# Patient Record
Sex: Female | Born: 1955 | Race: White | Hispanic: No | Marital: Married | Smoking: Never smoker
Health system: Southern US, Community
[De-identification: ages and names within clinical notes are randomized; demographics above are authoritative.]

## PROBLEM LIST (undated history)

## (undated) DIAGNOSIS — G43909 Migraine, unspecified, not intractable, without status migrainosus: Secondary | ICD-10-CM

## (undated) DIAGNOSIS — G709 Myoneural disorder, unspecified: Secondary | ICD-10-CM

## (undated) DIAGNOSIS — R7303 Prediabetes: Secondary | ICD-10-CM

## (undated) DIAGNOSIS — E229 Hyperfunction of pituitary gland, unspecified: Secondary | ICD-10-CM

## (undated) DIAGNOSIS — Z8669 Personal history of other diseases of the nervous system and sense organs: Secondary | ICD-10-CM

## (undated) DIAGNOSIS — D352 Benign neoplasm of pituitary gland: Secondary | ICD-10-CM

## (undated) DIAGNOSIS — M1990 Unspecified osteoarthritis, unspecified site: Secondary | ICD-10-CM

## (undated) HISTORY — DX: Hyperfunction of pituitary gland, unspecified: E22.9

## (undated) HISTORY — DX: Personal history of other diseases of the nervous system and sense organs: Z86.69

## (undated) HISTORY — PX: JOINT REPLACEMENT: SHX530

## (undated) HISTORY — DX: Benign neoplasm of pituitary gland: D35.2

## (undated) HISTORY — DX: Myoneural disorder, unspecified: G70.9

## (undated) HISTORY — PX: KNEE ARTHROSCOPY: SUR90

## (undated) HISTORY — PX: ABDOMINAL HYSTERECTOMY: SHX81

---

## 2004-10-22 ENCOUNTER — Ambulatory Visit: Payer: Self-pay | Admitting: Internal Medicine

## 2004-11-07 LAB — HM COLONOSCOPY: HM Colonoscopy: NORMAL

## 2006-10-04 ENCOUNTER — Ambulatory Visit: Payer: Self-pay | Admitting: Gastroenterology

## 2006-10-24 ENCOUNTER — Ambulatory Visit: Payer: Self-pay | Admitting: Gastroenterology

## 2007-01-23 ENCOUNTER — Ambulatory Visit: Payer: Self-pay | Admitting: Orthopaedic Surgery

## 2007-02-07 ENCOUNTER — Ambulatory Visit: Payer: Self-pay | Admitting: Orthopaedic Surgery

## 2007-02-14 ENCOUNTER — Ambulatory Visit: Payer: Self-pay | Admitting: Orthopaedic Surgery

## 2010-10-24 LAB — HM PAP SMEAR: HM Pap smear: NEGATIVE

## 2010-10-31 LAB — HM MAMMOGRAPHY: HM Mammogram: NORMAL

## 2011-05-19 ENCOUNTER — Encounter: Payer: Self-pay | Admitting: Internal Medicine

## 2011-05-19 ENCOUNTER — Ambulatory Visit (INDEPENDENT_AMBULATORY_CARE_PROVIDER_SITE_OTHER): Payer: BC Managed Care – PPO | Admitting: Internal Medicine

## 2011-05-19 VITALS — BP 138/70 | HR 84 | Temp 98.1°F | Resp 14 | Ht 63.0 in | Wt 134.5 lb

## 2011-05-19 DIAGNOSIS — R5383 Other fatigue: Secondary | ICD-10-CM

## 2011-05-19 DIAGNOSIS — Z1211 Encounter for screening for malignant neoplasm of colon: Secondary | ICD-10-CM

## 2011-05-19 DIAGNOSIS — E559 Vitamin D deficiency, unspecified: Secondary | ICD-10-CM

## 2011-05-19 DIAGNOSIS — G2581 Restless legs syndrome: Secondary | ICD-10-CM

## 2011-05-19 DIAGNOSIS — J329 Chronic sinusitis, unspecified: Secondary | ICD-10-CM

## 2011-05-19 DIAGNOSIS — K5909 Other constipation: Secondary | ICD-10-CM

## 2011-05-19 DIAGNOSIS — G709 Myoneural disorder, unspecified: Secondary | ICD-10-CM | POA: Insufficient documentation

## 2011-05-19 DIAGNOSIS — G4700 Insomnia, unspecified: Secondary | ICD-10-CM

## 2011-05-19 DIAGNOSIS — J45990 Exercise induced bronchospasm: Secondary | ICD-10-CM | POA: Insufficient documentation

## 2011-05-19 DIAGNOSIS — D229 Melanocytic nevi, unspecified: Secondary | ICD-10-CM

## 2011-05-19 DIAGNOSIS — K5900 Constipation, unspecified: Secondary | ICD-10-CM

## 2011-05-19 DIAGNOSIS — R5381 Other malaise: Secondary | ICD-10-CM

## 2011-05-19 DIAGNOSIS — E538 Deficiency of other specified B group vitamins: Secondary | ICD-10-CM

## 2011-05-19 DIAGNOSIS — D239 Other benign neoplasm of skin, unspecified: Secondary | ICD-10-CM

## 2011-05-19 LAB — COMPREHENSIVE METABOLIC PANEL
AST: 16 U/L (ref 0–37)
BUN: 20 mg/dL (ref 6–23)
CO2: 26 mEq/L (ref 19–32)
Calcium: 9.5 mg/dL (ref 8.4–10.5)
Chloride: 103 mEq/L (ref 96–112)
Creat: 0.85 mg/dL (ref 0.50–1.10)
Total Bilirubin: 0.3 mg/dL (ref 0.3–1.2)

## 2011-05-19 LAB — CBC WITH DIFFERENTIAL/PLATELET
HCT: 38 % (ref 36.0–46.0)
Hemoglobin: 12.4 g/dL (ref 12.0–15.0)
Lymphocytes Relative: 36 % (ref 12–46)
Monocytes Absolute: 0.6 10*3/uL (ref 0.1–1.0)
Monocytes Relative: 8 % (ref 3–12)
Neutro Abs: 3.7 10*3/uL (ref 1.7–7.7)
WBC: 7.2 10*3/uL (ref 4.0–10.5)

## 2011-05-19 MED ORDER — PREDNISONE (PAK) 10 MG PO TABS: ORAL_TABLET | ORAL | Status: AC

## 2011-05-19 MED ORDER — AMOXICILLIN-POT CLAVULANATE 875-125 MG PO TABS: 1.0000 | ORAL_TABLET | Freq: Two times a day (BID) | ORAL | Status: AC

## 2011-05-19 MED ORDER — LACTULOSE 20 GM/30ML PO SOLN: 30.0000 mL | Freq: Four times a day (QID) | ORAL | Status: AC | PRN

## 2011-05-19 NOTE — Patient Instructions (Addendum)
Please try Sudafed PE 10 mg in the morning once.  Use afrin later in the day to manage congestion.  We will use Augmentin for 10 days .    Four your insomnia and restless legs,  Try a combination of 50 mg trazodone and 300 mg neurontin at bedtime before you increase the trazodone dose.   Take the lactulose every 6 hours until you feel you have evacuated your bowel,s  Then start daily miralax.  If this does not impmorve you constipaiton,  Call and we will try the new medication (amitiza)

## 2011-05-19 NOTE — Progress Notes (Signed)
  Subjective:    Patient ID: Jo Ryan, female    DOB: 15-Sep-1955, 55 y.o.   MRN: [id number]  HPI 55 yo white female transferring care from [facility] presents for establishment of care,  She has several complaints. Her first is persistent sinus and facial pain for the past 5 or 6  days,  Symptoms on and off for 3 weeks,  Asthma but no allergic rhinitis.  2nd one is chronic constipation with prior trial of herbal teas containing senna      Review of Systems  Constitutional: Negative for fever, chills and unexpected weight change.  HENT: Negative for hearing loss, ear pain, nosebleeds, congestion, sore throat, facial swelling, rhinorrhea, sneezing, mouth sores, trouble swallowing, neck pain, neck stiffness, voice change, postnasal drip, sinus pressure, tinnitus and ear discharge.   Eyes: Negative for pain, discharge, redness and visual disturbance.  Respiratory: Negative for cough, chest tightness, shortness of breath, wheezing and stridor.   Cardiovascular: Negative for chest pain, palpitations and leg swelling.  Musculoskeletal: Negative for myalgias and arthralgias.  Skin: Negative for color change and rash.  Neurological: Negative for dizziness, weakness, light-headedness and headaches.  Hematological: Negative for adenopathy.       Objective:   Physical Exam  Constitutional: She is oriented to person, place, and time. She appears well-developed and well-nourished.  HENT:  Mouth/Throat: Oropharynx is clear and moist.  Eyes: EOM are normal. Pupils are equal, round, and reactive to light. No scleral icterus.  Neck: Normal range of motion. Neck supple. No JVD present. No thyromegaly present.  Cardiovascular: Normal rate, regular rhythm, normal heart sounds and intact distal pulses.   Pulmonary/Chest: Effort normal and breath sounds normal.  Abdominal: Soft. Bowel sounds are normal. She exhibits no mass. There is no tenderness.  Musculoskeletal: Normal range of motion. She  exhibits no edema.  Lymphadenopathy:    She has no cervical adenopathy.  Neurological: She is alert and oriented to person, place, and time.  Skin: Skin is warm and dry.  Psychiatric: She has a normal mood and affect.          Assessment & Plan:  Sinusitis, acute:  will treat with augmentin decongestant, and saline nasal spray.

## 2011-05-21 ENCOUNTER — Encounter: Payer: Self-pay | Admitting: Internal Medicine

## 2011-05-21 DIAGNOSIS — G2581 Restless legs syndrome: Secondary | ICD-10-CM | POA: Insufficient documentation

## 2011-05-21 DIAGNOSIS — K5909 Other constipation: Secondary | ICD-10-CM | POA: Insufficient documentation

## 2011-05-21 DIAGNOSIS — Z8669 Personal history of other diseases of the nervous system and sense organs: Secondary | ICD-10-CM | POA: Insufficient documentation

## 2011-05-21 DIAGNOSIS — K581 Irritable bowel syndrome with constipation: Secondary | ICD-10-CM | POA: Insufficient documentation

## 2011-05-21 DIAGNOSIS — D352 Benign neoplasm of pituitary gland: Secondary | ICD-10-CM | POA: Insufficient documentation

## 2011-05-21 NOTE — Assessment & Plan Note (Signed)
Discussed use of amitiza but will first try trial of lactulose followed by daily miralax.  She is up to date on screening colonoscopy

## 2011-05-29 ENCOUNTER — Telehealth: Payer: Self-pay | Admitting: Internal Medicine

## 2011-05-29 NOTE — Telephone Encounter (Signed)
Please call patient with her lab results.

## 2011-05-29 NOTE — Telephone Encounter (Signed)
Morrie Sheldon left her a message on Friday to call back.  Her b12 and vitmamin d are a little low,  everything else is fine. Is she taking any vitmamin supplements that have b12 or vitamin d in them and how much?

## 2011-05-30 ENCOUNTER — Telehealth: Payer: Self-pay | Admitting: Internal Medicine

## 2011-05-30 NOTE — Telephone Encounter (Signed)
I would recommend tha tshe start taking otc vitmain D supplement 1000 units daily,  And B 12 sublingual tablets one  Tablet daily,. Repeat both levels  Vit 25 OHD and Vitamin B12  in 3 months.

## 2011-05-30 NOTE — Telephone Encounter (Signed)
Patient labs showed B-12 and D were borderline low. Patient informed me she is not taking any supplements. What should she do next?

## 2011-05-31 NOTE — Telephone Encounter (Signed)
I informed patient that you recommend she take OTC Vitamin D supplement 1000 units daily and B12 sublingual tablet once a day and we need to repeat both levels in 3 months.

## 2011-07-13 ENCOUNTER — Telehealth: Payer: Self-pay | Admitting: Internal Medicine

## 2011-07-14 ENCOUNTER — Ambulatory Visit (INDEPENDENT_AMBULATORY_CARE_PROVIDER_SITE_OTHER): Payer: BC Managed Care – PPO | Admitting: Internal Medicine

## 2011-07-14 ENCOUNTER — Encounter: Payer: Self-pay | Admitting: Internal Medicine

## 2011-07-14 VITALS — BP 138/80 | HR 67 | Temp 98.3°F | Resp 14 | Ht 63.0 in | Wt 139.5 lb

## 2011-07-14 DIAGNOSIS — H6690 Otitis media, unspecified, unspecified ear: Secondary | ICD-10-CM

## 2011-07-14 MED ORDER — PREDNISONE (PAK) 10 MG PO TABS: ORAL_TABLET | ORAL | Status: AC

## 2011-07-14 MED ORDER — AMOXICILLIN-POT CLAVULANATE 875-125 MG PO TABS: 1.0000 | ORAL_TABLET | Freq: Two times a day (BID) | ORAL | Status: AC

## 2011-07-14 MED ORDER — MECLIZINE HCL 25 MG PO TABS: 25.0000 mg | ORAL_TABLET | Freq: Three times a day (TID) | ORAL | Status: AC | PRN

## 2011-07-14 NOTE — Progress Notes (Signed)
  Subjective:    Patient ID: Jo Ryan, female    DOB: 1955/12/22, 55 y.o.   MRN: [id number]  HPI  55 yo white female presents with recurrent vertigo over the past week.  Episodes have occurred while she is in the shower and at work,  Most episodes preciptated by tilting her head back. No prior episodes of vertigo.  Concurrent symptoms include a dull headache on and off for the past  occurreing on the right side, along with sinus drainage and ear fullness.  No fevers or neck stiffness.  No vision changes or other neurologic symptoms.  Past Medical History  Diagnosis Date  . Asthma   . Vitamin D deficiency   . Neuromuscular disorder     cervical disk disease  . Pituitary microadenoma with hyperprolactinemia     treated with Parlodel  . History of migraine headaches    Current Outpatient Prescriptions on File Prior to Visit  Medication Sig Dispense Refill  . butalbital-acetaminophen-caffeine (FIORICET, ESGIC) 50-325-40 MG per tablet Take 1 tablet by mouth every 4 (four) hours as needed. Do not exceed 6 tablets per day.       . Fluticasone-Salmeterol (ADVAIR) 250-50 MCG/DOSE AEPB Inhale 1 puff into the lungs as needed.        . gabapentin (NEURONTIN) 300 MG capsule Take 300 mg by mouth 2 (two) times daily as needed.        . traZODone (DESYREL) 50 MG tablet Take 50 mg by mouth at bedtime.          Review of Systems  Constitutional: Negative for fever, chills and unexpected weight change.  HENT: Positive for ear pain, congestion and postnasal drip. Negative for hearing loss, nosebleeds, sore throat, facial swelling, rhinorrhea, sneezing, mouth sores, trouble swallowing, neck pain, neck stiffness, voice change, sinus pressure, tinnitus and ear discharge.   Eyes: Negative for pain, discharge, redness and visual disturbance.  Respiratory: Negative for cough, chest tightness, shortness of breath, wheezing and stridor.   Cardiovascular: Negative for chest pain, palpitations and leg swelling.    Musculoskeletal: Negative for myalgias and arthralgias.  Skin: Negative for color change and rash.  Neurological: Positive for dizziness and headaches. Negative for weakness and light-headedness.  Hematological: Negative for adenopathy.       Objective:   Physical Exam  Constitutional: She is oriented to person, place, and time. She appears well-developed and well-nourished.  HENT:  Right Ear: Tympanic membrane is erythematous and bulging.  Left Ear: Tympanic membrane is bulging.  Mouth/Throat: Oropharynx is clear and moist.  Eyes: EOM are normal. Pupils are equal, round, and reactive to light. No scleral icterus.  Neck: Normal range of motion. Neck supple. No JVD present. No thyromegaly present.  Cardiovascular: Normal rate, regular rhythm, normal heart sounds and intact distal pulses.   Pulmonary/Chest: Effort normal and breath sounds normal.  Abdominal: Soft. Bowel sounds are normal. She exhibits no mass. There is no tenderness.  Musculoskeletal: Normal range of motion. She exhibits no edema.  Lymphadenopathy:    She has no cervical adenopathy.  Neurological: She is alert and oriented to person, place, and time.  Skin: Skin is warm and dry.  Psychiatric: She has a normal mood and affect.          Assessment & Plan:  Vertigo;  Secondary to otitis media suggested by exam.  Abx, prednisone, sudafed PE and meclizine

## 2011-07-16 ENCOUNTER — Encounter: Payer: Self-pay | Admitting: Internal Medicine

## 2011-07-16 MED ORDER — FLUTICASONE FUROATE 27.5 MCG/SPRAY NA SUSP: 1.0000 | Freq: Every day | NASAL | Status: DC

## 2011-07-16 NOTE — Patient Instructions (Signed)
I am treating you for otitis media which appears to be the cause of your vertigo.  If your symptoms do no imporve in week, please let me know.

## 2011-10-09 ENCOUNTER — Ambulatory Visit (INDEPENDENT_AMBULATORY_CARE_PROVIDER_SITE_OTHER): Payer: BC Managed Care – PPO | Admitting: Internal Medicine

## 2011-10-09 ENCOUNTER — Encounter: Payer: Self-pay | Admitting: Internal Medicine

## 2011-10-09 VITALS — BP 134/68 | HR 69 | Temp 98.4°F | Resp 14 | Ht 64.0 in | Wt 135.8 lb

## 2011-10-09 DIAGNOSIS — J40 Bronchitis, not specified as acute or chronic: Secondary | ICD-10-CM

## 2011-10-09 DIAGNOSIS — J329 Chronic sinusitis, unspecified: Secondary | ICD-10-CM

## 2011-10-09 MED ORDER — PREDNISONE (PAK) 10 MG PO TABS: ORAL_TABLET | ORAL | Status: AC

## 2011-10-09 MED ORDER — TRAZODONE HCL 50 MG PO TABS: 50.0000 mg | ORAL_TABLET | Freq: Every day | ORAL | Status: AC

## 2011-10-09 MED ORDER — AMOXICILLIN-POT CLAVULANATE 875-125 MG PO TABS: 1.0000 | ORAL_TABLET | Freq: Two times a day (BID) | ORAL | Status: AC

## 2011-10-09 MED ORDER — FLUTICASONE FUROATE 27.5 MCG/SPRAY NA SUSP: 1.0000 | Freq: Every day | NASAL | Status: DC

## 2011-10-09 MED ORDER — HYDROCOD POLST-CHLORPHEN POLST 10-8 MG/5ML PO LQCR: 5.0000 mL | Freq: Two times a day (BID) | ORAL | Status: DC | PRN

## 2011-10-09 NOTE — Assessment & Plan Note (Signed)
Given chronicity of symptoms, development of facial pain and exam consistent with bacterial URI,  Will treat with empiric antibiotics, decongestants, and saline lavage.   

## 2011-10-09 NOTE — Patient Instructions (Addendum)
Augmentin two times daily for 7 days  , take with food   Prednisone taper for the wheezing and the laryngitis.   Tussionex for nighttime cough,  Delsym for the cough   Rest your voice as much as possilble

## 2011-10-09 NOTE — Progress Notes (Signed)
Subjective:    Patient ID: Jo Ryan, female    DOB: [birth year], 56 y.o.   MRN: [id number]  HPI  [name] is a 56 yr old white female who presents with head cold of several days, accompanied by cough , sore throat,  Achiness, which started yesterday ,  Productive cough,  purulent bronchitis.   Some wheezing.    Past Medical History  Diagnosis Date  . Asthma   . Vitamin d deficiency   . Neuromuscular disorder     cervical disk disease  . Pituitary microadenoma with hyperprolactinemia     treated with Parlodel  . History of migraine headaches    Current Outpatient Prescriptions on File Prior to Visit  Medication Sig Dispense Refill  . butalbital-acetaminophen-caffeine (FIORICET, ESGIC) 50-325-40 MG per tablet Take 1 tablet by mouth every 4 (four) hours as needed. Do not exceed 6 tablets per day.       . Fluticasone-Salmeterol (ADVAIR) 250-50 MCG/DOSE AEPB Inhale 1 puff into the lungs as needed.        . gabapentin (NEURONTIN) 300 MG capsule Take 300 mg by mouth 2 (two) times daily as needed.        . meclizine (ANTIVERT) 25 MG tablet Take 1 tablet (25 mg total) by mouth 3 (three) times daily as needed for dizziness or nausea.  30 tablet  1     Review of Systems  Constitutional: Negative for fever, chills and unexpected weight change.  HENT: Negative for hearing loss, ear pain, nosebleeds, congestion, sore throat, facial swelling, rhinorrhea, sneezing, mouth sores, trouble swallowing, neck pain, neck stiffness, voice change, postnasal drip, sinus pressure, tinnitus and ear discharge.   Eyes: Negative for pain, discharge, redness and visual disturbance.  Respiratory: Negative for cough, chest tightness, shortness of breath, wheezing and stridor.   Cardiovascular: Negative for chest pain, palpitations and leg swelling.  Musculoskeletal: Negative for myalgias and arthralgias.  Skin: Negative for color change and rash.  Neurological: Negative for dizziness, weakness, light-headedness  and headaches.  Hematological: Negative for adenopathy.       Objective:   Physical Exam  Constitutional: She is oriented to person, place, and time. She appears well-developed and well-nourished.  HENT:  Nose: Right sinus exhibits maxillary sinus tenderness. Left sinus exhibits maxillary sinus tenderness.  Mouth/Throat: Oropharynx is clear and moist.  Eyes: EOM are normal. Pupils are equal, round, and reactive to light. No scleral icterus.  Neck: Normal range of motion. Neck supple. No JVD present. No thyromegaly present.  Cardiovascular: Normal rate, regular rhythm, normal heart sounds and intact distal pulses.   Pulmonary/Chest: Effort normal. She has wheezes.  Abdominal: Soft. Bowel sounds are normal. She exhibits no mass. There is no tenderness.  Musculoskeletal: Normal range of motion. She exhibits no edema.  Lymphadenopathy:    She has no cervical adenopathy.  Neurological: She is alert and oriented to person, place, and time.  Skin: Skin is warm and dry.  Psychiatric: She has a normal mood and affect.          Assessment & Plan:   Sinusitis Given chronicity of symptoms, development of facial pain and exam consistent with bacterial URI,  Will treat with empiric antibiotics, decongestants, and saline lavage.     Updated Medication List Outpatient Encounter Prescriptions as of 10/09/2011  Medication Sig Dispense Refill  . butalbital-acetaminophen-caffeine (FIORICET, ESGIC) 50-325-40 MG per tablet Take 1 tablet by mouth every 4 (four) hours as needed. Do not exceed 6 tablets per day.       Marland Kitchen  fluticasone (VERAMYST) 27.5 MCG/SPRAY nasal spray Place 1 spray into the nose daily.  10 g  3  . Fluticasone-Salmeterol (ADVAIR) 250-50 MCG/DOSE AEPB Inhale 1 puff into the lungs as needed.        . gabapentin (NEURONTIN) 300 MG capsule Take 300 mg by mouth 2 (two) times daily as needed.        . meclizine (ANTIVERT) 25 MG tablet Take 1 tablet (25 mg total) by mouth 3 (three) times  daily as needed for dizziness or nausea.  30 tablet  1  . traZODone (DESYREL) 50 MG tablet Take 1 tablet (50 mg total) by mouth at bedtime.  30 tablet  3  . DISCONTD: fluticasone (VERAMYST) 27.5 MCG/SPRAY nasal spray Place 1 spray into the nose daily.  10 g  2  . DISCONTD: traZODone (DESYREL) 50 MG tablet Take 50 mg by mouth at bedtime.        Marland Kitchen amoxicillin-clavulanate (AUGMENTIN) 875-125 MG per tablet Take 1 tablet by mouth 2 (two) times daily.  14 tablet  0  . chlorpheniramine-HYDROcodone (TUSSIONEX PENNKINETIC ER) 10-8 MG/5ML LQCR Take 5 mLs by mouth every 12 (twelve) hours as needed.  200 mL  0  . predniSONE (STERAPRED UNI-PAK) 10 MG tablet 6 tablets on Day 1 , then reduce by 1 tablet daily until gone  21 tablet  0  . DISCONTD: chlorpheniramine-HYDROcodone (TUSSIONEX PENNKINETIC ER) 10-8 MG/5ML LQCR Take 5 mLs by mouth every 12 (twelve) hours as needed.  200 mL  0

## 2011-10-27 ENCOUNTER — Ambulatory Visit: Payer: BC Managed Care – PPO | Admitting: Internal Medicine

## 2011-11-03 NOTE — Telephone Encounter (Signed)
error 

## 2011-11-08 ENCOUNTER — Encounter: Payer: Self-pay | Admitting: Internal Medicine

## 2011-11-08 ENCOUNTER — Ambulatory Visit (INDEPENDENT_AMBULATORY_CARE_PROVIDER_SITE_OTHER): Payer: BC Managed Care – PPO | Admitting: Internal Medicine

## 2011-11-08 VITALS — BP 124/60 | HR 90 | Temp 98.9°F | Resp 16 | Wt 137.2 lb

## 2011-11-08 DIAGNOSIS — J45990 Exercise induced bronchospasm: Secondary | ICD-10-CM

## 2011-11-08 DIAGNOSIS — K581 Irritable bowel syndrome with constipation: Secondary | ICD-10-CM

## 2011-11-08 DIAGNOSIS — Z13220 Encounter for screening for lipoid disorders: Secondary | ICD-10-CM

## 2011-11-08 DIAGNOSIS — K589 Irritable bowel syndrome without diarrhea: Secondary | ICD-10-CM

## 2011-11-08 DIAGNOSIS — E559 Vitamin D deficiency, unspecified: Secondary | ICD-10-CM

## 2011-11-08 DIAGNOSIS — Z1211 Encounter for screening for malignant neoplasm of colon: Secondary | ICD-10-CM

## 2011-11-08 DIAGNOSIS — G2581 Restless legs syndrome: Secondary | ICD-10-CM

## 2011-11-08 DIAGNOSIS — M130 Polyarthritis, unspecified: Secondary | ICD-10-CM

## 2011-11-08 DIAGNOSIS — Z0000 Encounter for general adult medical examination without abnormal findings: Secondary | ICD-10-CM

## 2011-11-08 MED ORDER — PREDNISONE (PAK) 10 MG PO TABS: ORAL_TABLET | ORAL | Status: AC

## 2011-11-08 NOTE — Patient Instructions (Signed)
I am giving you samples of amitiza which is approved for treatment of both constipation due to IBS.  Start with the 8 mcg dose,  One tablet twice tmes per day  If after one week your bowel schedule has not improved, try the higher dose.    Return for fasting labs at your convenience.

## 2011-11-08 NOTE — Assessment & Plan Note (Addendum)
She has been using suppositories to help defecate.  She has no prior trial of an adhesive increasing her fiber consumption is difficult due to her allergy to wheat. Discussed a trial of hematochezia and samples given of both doses 8 mcg and 24 mcg. I have advised that she start with the 8 mcg twice daily dose and increase to 24 after one week if there is no change in bowel habits.

## 2011-11-08 NOTE — Assessment & Plan Note (Signed)
With recent exacerbation treated by urgent care without prednisone ,  Will give patient rx for prednisone taper to keep on hand  For the next episode.

## 2011-11-08 NOTE — Progress Notes (Signed)
Patient ID: Jo Ryan, female   DOB: June 23, 1956, 56 y.o.   MRN: [id number]   Patient Active Problem List  Diagnoses  . Exercise-induced asthma  . Insomnia  . Neuromuscular disorder  . Screening for colon cancer  . Restless legs syndrome  . Pituitary microadenoma with hyperprolactinemia  . History of migraine headaches  . Irritable bowel syndrome with constipation  . Sinusitis  . Polyarthritis    Subjective:  CC:   Chief Complaint  Patient presents with  . Gynecologic Exam    doesnt know if she needs PAP    HPI:   Jo Ryan is a 56 y.o. female who presents for her annual exam.  She was recently treated for a URI with levaquin by Urgent care for cough, sinus drainage, subjective fever .  Sought medical attention after  Less than 24 hours of symptoms bc of concern for asthma recurrence .  Was not given prednisone for unclear reasons,  But had used her albuterol MDI just prior to evaluation so may not have been wheezing.  Per patient her room air 02 sats were 99% Symptoms have resolved.     Her other issues today is the recurretce of pain in multiple joints on both hands/fingers  Have been swollen and tender,  using alleve prn .  Has had knee pain as well, but has a history of arthroscopic surgeries for ligament repairs.     Past Medical History  Diagnosis Date  . Asthma   . Vitamin d deficiency   . Neuromuscular disorder     cervical disk disease  . Pituitary microadenoma with hyperprolactinemia     treated with Parlodel  . History of migraine headaches     Past Surgical History  Procedure Date  . Joint replacement     arthroscopy both knees  . Abdominal hysterectomy     age 63         The following portions of the patient's history were reviewed and updated as appropriate: Allergies, current medications, and problem list.    Review of Systems:   12 Pt  review of systems was negative except those addressed in the HPI,     History   Social  History  . Marital Status: Married    Spouse Name: N/A    Number of Children: N/A  . Years of Education: N/A   Occupational History  . Not on file.   Social History Main Topics  . Smoking status: Never Smoker   . Smokeless tobacco: Never Used  . Alcohol Use: Yes     occasional  . Drug Use: No  . Sexually Active: Not on file   Other Topics Concern  . Not on file   Social History Narrative  . No narrative on file    Objective:  BP 124/60  Pulse 90  Temp(Src) 98.9 F (37.2 C) (Oral)  Resp 16  Wt 137 lb 4 oz (62.256 kg)  SpO2 98%  General appearance: alert, cooperative and appears stated age Ears: normal TM's and external ear canals both ears Throat: lips, mucosa, and tongue normal; teeth and gums normal Neck: no adenopathy, no carotid bruit, supple, symmetrical, trachea midline and thyroid not enlarged, symmetric, no tenderness/mass/nodules Back: symmetric, no curvature. ROM normal. No CVA tenderness. Lungs: clear to auscultation bilaterally Breasts:  Normal, symmetric,  No masses or lesions.  Heart: regular rate and rhythm, S1, S2 normal, no murmur, click, rub or gallop Abdomen: soft, non-tender; bowel sounds normal; no masses,  no organomegaly Pulses: 2+ and symmetric Skin: Skin color, texture, turgor normal. No rashes or lesions Lymph nodes: Cervical, supraclavicular, and axillary nodes normal. GYN: deferred per patient request.  Assessment and Plan:  Irritable bowel syndrome with constipation She has been using suppositories to help defecate.  She has no prior trial of an adhesive increasing her fiber consumption is difficult due to her allergy to wheat. Discussed a trial of hematochezia and samples given of both doses 8 mcg and 24 mcg. I have advised that she start with the 8 mcg twice daily dose and increase to 24 after one week if there is no change in bowel habits.      Exercise-induced asthma With recent exacerbation treated by urgent care without  prednisone ,  Will give patient rx for prednisone taper to keep on hand  For the next episode.   Screening for colon cancer She had a normal screening colonoscopy at age 68. There is no family history of colon cancer so she is not due again for 3 years  Restless legs syndrome Symptoms appear to be controlled with trazodone gabapentin. No changes today  Polyarthritis she has had multiple joints painful several weeks. The joints are involved both hands both knees and elbows. Will initiate serologic workup for inflammatory conditions and including autoimmune disorders and gout.    Updated Medication List Outpatient Encounter Prescriptions as of 11/08/2011  Medication Sig Dispense Refill  . butalbital-acetaminophen-caffeine (FIORICET, ESGIC) 50-325-40 MG per tablet Take 1 tablet by mouth every 4 (four) hours as needed. Do not exceed 6 tablets per day.       . Fluticasone-Salmeterol (ADVAIR) 250-50 MCG/DOSE AEPB Inhale 1 puff into the lungs as needed.        . meclizine (ANTIVERT) 25 MG tablet Take 1 tablet (25 mg total) by mouth 3 (three) times daily as needed for dizziness or nausea.  30 tablet  1  . traZODone (DESYREL) 50 MG tablet Take 1 tablet (50 mg total) by mouth at bedtime.  30 tablet  3  . DISCONTD: gabapentin (NEURONTIN) 300 MG capsule Take 300 mg by mouth 2 (two) times daily as needed.        . lubiprostone (AMITIZA) 24 MCG capsule Take 1 capsule (24 mcg total) by mouth 2 (two) times daily with a meal.  14 capsule  0  . predniSONE (STERAPRED UNI-PAK) 10 MG tablet 6 tablets on Day 1 , then reduce by 1 tablet daily until gone  21 tablet  0  . DISCONTD: chlorpheniramine-HYDROcodone (TUSSIONEX PENNKINETIC ER) 10-8 MG/5ML LQCR Take 5 mLs by mouth every 12 (twelve) hours as needed.  200 mL  0  . DISCONTD: fluticasone (VERAMYST) 27.5 MCG/SPRAY nasal spray Place 1 spray into the nose daily.  10 g  3     Orders Placed This Encounter  Procedures  . Lipid panel  . COMPLETE METABOLIC PANEL  WITH GFR  . Magnesium  . Rheumatoid factor  . ANA  . CBC with Differential  . Vitamin D (25 hydroxy)  . Sedimentation rate  . C-reactive protein  . HM COLONOSCOPY    No Follow-up on file.

## 2011-11-09 ENCOUNTER — Telehealth: Payer: Self-pay | Admitting: *Deleted

## 2011-11-09 ENCOUNTER — Other Ambulatory Visit (INDEPENDENT_AMBULATORY_CARE_PROVIDER_SITE_OTHER): Payer: BC Managed Care – PPO | Admitting: *Deleted

## 2011-11-09 ENCOUNTER — Other Ambulatory Visit: Payer: Self-pay | Admitting: *Deleted

## 2011-11-09 DIAGNOSIS — K581 Irritable bowel syndrome with constipation: Secondary | ICD-10-CM

## 2011-11-09 DIAGNOSIS — Z13220 Encounter for screening for lipoid disorders: Secondary | ICD-10-CM

## 2011-11-09 DIAGNOSIS — M130 Polyarthritis, unspecified: Secondary | ICD-10-CM

## 2011-11-09 DIAGNOSIS — K589 Irritable bowel syndrome without diarrhea: Secondary | ICD-10-CM

## 2011-11-09 DIAGNOSIS — Z1239 Encounter for other screening for malignant neoplasm of breast: Secondary | ICD-10-CM

## 2011-11-09 LAB — COMPLETE METABOLIC PANEL WITH GFR
Albumin: 4.2 g/dL (ref 3.5–5.2)
Alkaline Phosphatase: 81 U/L (ref 39–117)
BUN: 13 mg/dL (ref 6–23)
CO2: 25 mEq/L (ref 19–32)
GFR, Est African American: >89 mL/min
GFR, Est Non African American: >89 mL/min
Glucose, Bld: 96 mg/dL (ref 70–99)
Potassium: 4.3 mEq/L (ref 3.5–5.3)
Total Protein: 6.4 g/dL (ref 6.0–8.3)

## 2011-11-09 LAB — CBC WITH DIFFERENTIAL/PLATELET
Basophils Absolute: 0 10*3/uL (ref 0.0–0.1)
Eosinophils Relative: 3.8 % (ref 0.0–5.0)
HCT: 36.8 % (ref 36.0–46.0)
Hemoglobin: 12.3 g/dL (ref 12.0–15.0)
Lymphocytes Relative: 39.6 % (ref 12.0–46.0)
Lymphs Abs: 1.8 10*3/uL (ref 0.7–4.0)
Monocytes Relative: 10.7 % (ref 3.0–12.0)
Neutro Abs: 2.1 10*3/uL (ref 1.4–7.7)
RBC: 4.05 Mil/uL (ref 3.87–5.11)
RDW: 13.1 % (ref 11.5–14.6)
WBC: 4.6 10*3/uL (ref 4.5–10.5)

## 2011-11-09 LAB — LIPID PANEL
Cholesterol: 179 mg/dL (ref 0–200)
HDL: 75.1 mg/dL (ref >39.00)
VLDL: 11.6 mg/dL (ref 0.0–40.0)

## 2011-11-09 MED ORDER — GABAPENTIN 300 MG PO CAPS: 300.0000 mg | ORAL_CAPSULE | Freq: Two times a day (BID) | ORAL | Status: DC | PRN

## 2011-11-09 NOTE — Telephone Encounter (Signed)
Patient is asking if she can get an order for a mammogram faxed to [facility]. She will schedule her own appt

## 2011-11-09 NOTE — Telephone Encounter (Signed)
ABSOLUTELY. SHOULD BE ON PRINTER

## 2011-11-10 LAB — C-REACTIVE PROTEIN: CRP: 0.04 mg/dL (ref <0.60)

## 2011-11-10 LAB — RHEUMATOID FACTOR: Rhuematoid fact SerPl-aCnc: <10 IU/mL (ref <=14)

## 2011-11-10 LAB — ANA: Anti Nuclear Antibody(ANA): NEGATIVE

## 2011-11-10 LAB — VITAMIN D 25 HYDROXY (VIT D DEFICIENCY, FRACTURES): Vit D, 25-Hydroxy: 34 ng/mL (ref 30–89)

## 2011-11-11 ENCOUNTER — Encounter: Payer: Self-pay | Admitting: Internal Medicine

## 2011-11-12 ENCOUNTER — Encounter: Payer: Self-pay | Admitting: Internal Medicine

## 2011-11-12 DIAGNOSIS — M130 Polyarthritis, unspecified: Secondary | ICD-10-CM | POA: Insufficient documentation

## 2011-11-12 MED ORDER — LUBIPROSTONE 24 MCG PO CAPS: 24.0000 ug | ORAL_CAPSULE | Freq: Two times a day (BID) | ORAL | Status: AC

## 2011-11-12 NOTE — Assessment & Plan Note (Signed)
Symptoms appear to be controlled with trazodone gabapentin. No changes today

## 2011-11-12 NOTE — Assessment & Plan Note (Signed)
she has had multiple joints painful several weeks. The joints are involved both hands both knees and elbows. Will initiate serologic workup for inflammatory conditions and including autoimmune disorders and gout.

## 2011-11-12 NOTE — Assessment & Plan Note (Signed)
She had a normal screening colonoscopy at age 56. There is no family history of colon cancer so she is not due again for 3 years

## 2011-11-13 NOTE — Telephone Encounter (Signed)
Order faxed to [facility]. Patient is aware and will make her own appt.

## 2011-11-30 ENCOUNTER — Telehealth: Payer: Self-pay | Admitting: Internal Medicine

## 2011-11-30 NOTE — Telephone Encounter (Signed)
Her mammogram was normal.  Repeat in one year 

## 2011-11-30 NOTE — Telephone Encounter (Signed)
Patient notified

## 2011-12-01 ENCOUNTER — Encounter: Payer: Self-pay | Admitting: Internal Medicine

## 2012-03-11 ENCOUNTER — Encounter: Payer: Self-pay | Admitting: Internal Medicine

## 2012-03-11 ENCOUNTER — Ambulatory Visit (INDEPENDENT_AMBULATORY_CARE_PROVIDER_SITE_OTHER): Payer: BC Managed Care – PPO | Admitting: Internal Medicine

## 2012-03-11 VITALS — BP 124/78 | HR 73 | Temp 97.4°F | Resp 14 | Wt 138.8 lb

## 2012-03-11 DIAGNOSIS — J45909 Unspecified asthma, uncomplicated: Secondary | ICD-10-CM

## 2012-03-11 DIAGNOSIS — J329 Chronic sinusitis, unspecified: Secondary | ICD-10-CM

## 2012-03-11 DIAGNOSIS — R1012 Left upper quadrant pain: Secondary | ICD-10-CM | POA: Insufficient documentation

## 2012-03-11 DIAGNOSIS — R109 Unspecified abdominal pain: Secondary | ICD-10-CM

## 2012-03-11 LAB — COMPREHENSIVE METABOLIC PANEL
Alkaline Phosphatase: 69 U/L (ref 39–117)
BUN: 12 mg/dL (ref 6–23)
CO2: 27 mEq/L (ref 19–32)
Creatinine, Ser: 0.7 mg/dL (ref 0.4–1.2)
GFR: 88.61 mL/min (ref >60.00)
Glucose, Bld: 99 mg/dL (ref 70–99)
Sodium: 137 mEq/L (ref 135–145)
Total Bilirubin: 0.3 mg/dL (ref 0.3–1.2)
Total Protein: 7.1 g/dL (ref 6.0–8.3)

## 2012-03-11 LAB — CBC WITH DIFFERENTIAL/PLATELET
Basophils Relative: 0.6 % (ref 0.0–3.0)
Eosinophils Relative: 3.9 % (ref 0.0–5.0)
HCT: 41.4 % (ref 36.0–46.0)
Hemoglobin: 13.5 g/dL (ref 12.0–15.0)
Lymphs Abs: 1.9 10*3/uL (ref 0.7–4.0)
MCV: 91.6 fl (ref 78.0–100.0)
Monocytes Absolute: 0.5 10*3/uL (ref 0.1–1.0)
Monocytes Relative: 7 % (ref 3.0–12.0)
Neutro Abs: 4.4 10*3/uL (ref 1.4–7.7)
Platelets: 178 10*3/uL (ref 150.0–400.0)
RBC: 4.52 Mil/uL (ref 3.87–5.11)
WBC: 7.2 10*3/uL (ref 4.5–10.5)

## 2012-03-11 LAB — POCT URINALYSIS DIPSTICK
Bilirubin, UA: NEGATIVE
Ketones, UA: NEGATIVE
Nitrite, UA: NEGATIVE
Protein, UA: NEGATIVE
pH, UA: 6.5

## 2012-03-11 MED ORDER — ALBUTEROL SULFATE HFA 108 (90 BASE) MCG/ACT IN AERS
2.0000 | INHALATION_SPRAY | Freq: Four times a day (QID) | RESPIRATORY_TRACT | Status: AC | PRN
Start: 2012-03-11 — End: 2019-05-15

## 2012-03-11 MED ORDER — FLUTICASONE PROPIONATE 50 MCG/ACT NA SUSP: 2.0000 | Freq: Every day | NASAL | Status: DC

## 2012-03-11 MED ORDER — LEVOFLOXACIN 500 MG PO TABS
500.0000 mg | ORAL_TABLET | Freq: Every day | ORAL | Status: AC
Start: 2012-03-11 — End: 2012-03-21

## 2012-03-11 NOTE — Progress Notes (Signed)
Patient ID: Jo Ryan, female   DOB: 09/02/1955, 56 y.o.   MRN: [id number]  Patient Active Problem List  Diagnosis  . Exercise-induced asthma  . Insomnia  . Neuromuscular disorder  . Restless legs syndrome  . Pituitary microadenoma with hyperprolactinemia  . History of migraine headaches  . Irritable bowel syndrome with constipation  . Sinusitis  . Polyarthritis  . LUQ pain    Subjective:  CC:   Chief Complaint  Patient presents with  . Sinusitis  . Headache    HPI:   Jo Ryan a 56 y.o. female who presents 1 week history of sinus pain,  Headache, congestion and purulent nasal drainage.  Subjective fevers and chills , spent Sat/Sunday in bed.  Today started having moderate to severe LUQ pain .some abdominal bloating last week due to intake of bread (has gluten intolerance), with weight gain and recent onset of loose stools which started yesterday. Marland Kitchen History of diverticulosis by colonoscopy but no history of diverticulitis.   Past Medical History  Diagnosis Date  . Asthma   . Vitamin d deficiency   . Neuromuscular disorder     cervical disk disease  . Pituitary microadenoma with hyperprolactinemia     treated with Parlodel  . History of migraine headaches     Past Surgical History  Procedure Date  . Joint replacement     arthroscopy both knees  . Abdominal hysterectomy     age 66         The following portions of the patient's history were reviewed and updated as appropriate: Allergies, current medications, and problem list.    Review of Systems:   12 Pt  review of systems was negative except those addressed in the HPI,     History   Social History  . Marital Status: Married    Spouse Name: N/A    Number of Children: N/A  . Years of Education: N/A   Occupational History  . Not on file.   Social History Main Topics  . Smoking status: Never Smoker   . Smokeless tobacco: Never Used  . Alcohol Use: Yes     occasional  . Drug Use: No   . Sexually Active: Not on file   Other Topics Concern  . Not on file   Social History Narrative  . No narrative on file    Objective:  BP 124/78  Pulse 73  Temp 97.4 F (36.3 C) (Oral)  Resp 14  Wt 138 lb 12 oz (62.937 kg)  SpO2 96%  General appearance: alert, cooperative and appears stated age Ears: erythematous  TM's bilaterally Throat: lips, mucosa, and tongue normal; teeth and gums normal Neck: no adenopathy, no carotid bruit, supple, symmetrical, trachea midline and thyroid not enlarged, symmetric, no tenderness/mass/nodules Back: symmetric, no curvature. ROM normal. No CVA tenderness. Lungs: clear to auscultation bilaterally Heart: regular rate and rhythm, S1, S2 normal, no murmur, click, rub or gallop Abdomen: soft, tender in LUQ, without guarding or rebound, bowel sounds normal; no masses,  no organomegaly Pulses: 2+ and symmetric Skin: Skin color, texture, turgor normal. No rashes or lesions Lymph nodes: Cervical, supraclavicular, and axillary nodes normal.  Assessment and Plan:  Sinusitis Given chronicity of symptoms, development of facial pain and exam consistent with bacterial URI,  Will treat with empiric antibiotics, decongestants, and saline lavage.    LUQ pain Etiology likely secondary to irritable bowel . But given her subjective fevers chills, malaise and history of diverticulosis, may need to consider  diverticulitis.  She has been given levaquin for sinusitis, will add metronidazole if no improvement in 2 or 3 days.     Updated Medication List Outpatient Encounter Prescriptions as of 03/11/2012  Medication Sig Dispense Refill  . butalbital-acetaminophen-caffeine (FIORICET, ESGIC) 50-325-40 MG per tablet Take 1 tablet by mouth every 4 (four) hours as needed. Do not exceed 6 tablets per day.       . gabapentin (NEURONTIN) 300 MG capsule Take 1 capsule (300 mg total) by mouth 2 (two) times daily as needed.  60 capsule  0  . meclizine (ANTIVERT) 25 MG  tablet Take 1 tablet (25 mg total) by mouth 3 (three) times daily as needed for dizziness or nausea.  30 tablet  1  . traZODone (DESYREL) 50 MG tablet Take 1 tablet (50 mg total) by mouth at bedtime.  30 tablet  3  . albuterol (PROVENTIL HFA;VENTOLIN HFA) 108 (90 BASE) MCG/ACT inhaler Inhale 2 puffs into the lungs every 6 (six) hours as needed for wheezing.  1 Inhaler  2  . fluticasone (FLONASE) 50 MCG/ACT nasal spray Place 2 sprays into the nose daily.  16 g  6  . levofloxacin (LEVAQUIN) 500 MG tablet Take 1 tablet (500 mg total) by mouth daily.  7 tablet  0  . DISCONTD: Fluticasone-Salmeterol (ADVAIR) 250-50 MCG/DOSE AEPB Inhale 1 puff into the lungs as needed.           Orders Placed This Encounter  Procedures  . Urine Culture  . CBC with Differential  . Comprehensive metabolic panel  . POCT Urinalysis Dipstick    No Follow-up on file.

## 2012-03-11 NOTE — Assessment & Plan Note (Signed)
Etiology likely secondary to irritable bowel . But given her subjective fevers chills, malaise and history of diverticulosis, may need to consider diverticulitis.  She has been given levaquin for sinusitis, will add metronidazole if no improvement in 2 or 3 days.

## 2012-03-11 NOTE — Assessment & Plan Note (Signed)
Given chronicity of symptoms, development of facial pain and exam consistent with bacterial URI,  Will treat with empiric antibiotics, decongestants, and saline lavage.   

## 2012-03-11 NOTE — Patient Instructions (Addendum)
Take the levaquin once daily for 7 days,  If the stools become actual diarrhea, call and i will add metrnidazole  For colitis.  Try Gas X (simethicone) for gas pains.    Please eat some yogurt  Daily

## 2012-03-13 LAB — URINE CULTURE: Colony Count: 50000

## 2012-04-06 ENCOUNTER — Other Ambulatory Visit: Payer: Self-pay | Admitting: Internal Medicine

## 2012-04-08 ENCOUNTER — Other Ambulatory Visit: Payer: Self-pay | Admitting: Internal Medicine

## 2012-04-08 MED ORDER — GABAPENTIN 300 MG PO CAPS: 300.0000 mg | ORAL_CAPSULE | Freq: Two times a day (BID) | ORAL | Status: DC

## 2014-02-27 ENCOUNTER — Ambulatory Visit: Payer: Self-pay | Admitting: Unknown Physician Specialty

## 2015-04-30 ENCOUNTER — Other Ambulatory Visit: Payer: Self-pay | Admitting: Orthopedic Surgery

## 2015-04-30 DIAGNOSIS — M25562 Pain in left knee: Secondary | ICD-10-CM

## 2015-04-30 DIAGNOSIS — M2392 Unspecified internal derangement of left knee: Secondary | ICD-10-CM

## 2015-05-04 ENCOUNTER — Ambulatory Visit
Admission: RE | Admit: 2015-05-04 | Discharge: 2015-05-04 | Disposition: A | Discharge status: Home / Self Care | Payer: BC Managed Care – PPO | Source: Ambulatory Visit | Attending: Orthopedic Surgery | Admitting: Orthopedic Surgery

## 2015-05-04 DIAGNOSIS — M25562 Pain in left knee: Secondary | ICD-10-CM | POA: Diagnosis present

## 2015-05-04 DIAGNOSIS — M2392 Unspecified internal derangement of left knee: Secondary | ICD-10-CM | POA: Diagnosis present

## 2015-05-04 DIAGNOSIS — X58XXXA Exposure to other specified factors, initial encounter: Secondary | ICD-10-CM | POA: Insufficient documentation

## 2015-05-04 DIAGNOSIS — S83242A Other tear of medial meniscus, current injury, left knee, initial encounter: Secondary | ICD-10-CM | POA: Insufficient documentation

## 2016-03-27 DIAGNOSIS — E559 Vitamin D deficiency, unspecified: Secondary | ICD-10-CM | POA: Insufficient documentation

## 2017-01-04 ENCOUNTER — Ambulatory Visit (INDEPENDENT_AMBULATORY_CARE_PROVIDER_SITE_OTHER): Payer: BC Managed Care – PPO | Admitting: Podiatry

## 2017-01-04 ENCOUNTER — Ambulatory Visit (INDEPENDENT_AMBULATORY_CARE_PROVIDER_SITE_OTHER): Payer: BC Managed Care – PPO

## 2017-01-04 ENCOUNTER — Encounter: Payer: Self-pay | Admitting: Podiatry

## 2017-01-04 DIAGNOSIS — M722 Plantar fascial fibromatosis: Secondary | ICD-10-CM

## 2017-01-04 MED ORDER — MELOXICAM 15 MG PO TABS: 15.0000 mg | ORAL_TABLET | Freq: Every day | ORAL | 2 refills | Status: AC

## 2017-01-04 NOTE — Patient Instructions (Signed)

## 2017-01-05 DIAGNOSIS — M722 Plantar fascial fibromatosis: Secondary | ICD-10-CM | POA: Insufficient documentation

## 2017-01-05 NOTE — Progress Notes (Signed)
Subjective:    Patient ID: Jo Ryan, female   DOB: 61 y.o.   MRN: [id number]   HPI 62 year old female presents the office for concerns of painful bunion of her left heel which is been ongoing for 2 weeks. She denies any recent injury or trauma. She has no numbness or tingling. The pain does not wake recommended. Denies any claudication symptoms. The pain is worse in the morning or after periods of rest when she stands back up. She's had no other treatment other than taking Aleve which has not been helping. She has no other concerns today. No other complaints.   Review of Systems  All other systems reviewed and are negative.       Objective:  Physical Exam General: AAO x3, NAD  Dermatological: Skin is warm, dry and supple bilateral. Nails x 10 are well manicured; remaining integument appears unremarkable at this time. There are no open sores, no preulcerative lesions, no rash or signs of infection present.  Vascular: Dorsalis Pedis artery and Posterior Tibial artery pedal pulses are 2/4 bilateral with immedate capillary fill time. Pedal hair growth present. No varicosities and no lower extremity edema present bilateral. There is no pain with calf compression, swelling, warmth, erythema.   Neruologic: Grossly intact via light touch bilateral. Vibratory intact via tuning fork bilateral. Protective threshold with Semmes Wienstein monofilament intact to all pedal sites bilateral. Negative tinel sign.   Musculoskeletal: Tenderness to palpation along the plantar medial tubercle of the calcaneus at the insertion of plantar fascia on the left foot. There is no pain along the course of the plantar fascia within the arch of the foot. Plantar fascia appears to be intact. There is no pain with lateral compression of the calcaneus or pain with vibratory sensation. There is no pain along the course or insertion of the achilles tendon. No other areas of tenderness to bilateral lower extremities.  Muscular strength 5/5 in all groups tested bilateral.  Gait: Unassisted, Nonantalgic.      Assessment:     61 year old female left heel pain likely plantar fasciitis    Plan:     -Treatment options discussed including all alternatives, risks, and complications -Etiology of symptoms were discussed -X-rays were obtained and reviewed with the patient. No evidence of acute fracture identified. -Patient elects to proceed with steroid injection into the left heel. Under sterile skin preparation, a total of 2.5cc of kenalog 10, 0.5% Marcaine plain, and 2% lidocaine plain were infiltrated into the symptomatic area without complication. A band-aid was applied. Patient tolerated the injection well without complication. Post-injection care with discussed with the patient. Discussed with the patient to ice the area over the next couple of days to help prevent a steroid flare.  -Prescribed mobic. Discussed side effects of the medication and directed to stop if any are to occur and call the office.  -Plantar fascial brace dispensed -Discussed shoe gear modifications and orthotics -Stretching and icing exercises daily -Follow-up in 3 weeks or sooner.  Celesta Gentile, DPM

## 2017-03-29 DIAGNOSIS — R7303 Prediabetes: Secondary | ICD-10-CM | POA: Insufficient documentation

## 2019-04-04 DIAGNOSIS — J321 Chronic frontal sinusitis: Secondary | ICD-10-CM | POA: Insufficient documentation

## 2019-05-16 NOTE — Discharge Instructions (Signed)
[facility] °[facility] °ENDOSCOPIC SINUS SURGERY °[facility] ° °What is Functional Endoscopic Sinus Surgery? ° The Surgery involves making the natural openings of the sinuses larger by removing the bony partitions that separate the sinuses from the nasal cavity.  The natural sinus lining is preserved as much as possible to allow the sinuses to resume normal function after the surgery.  In some patients nasal polyps (excessively swollen lining of the sinuses) may be removed to relieve obstruction of the sinus openings.  The surgery is performed through the nose using lighted scopes, which eliminates the need for incisions on the face.  A septoplasty is a different procedure which is sometimes performed with sinus surgery.  It involves straightening the boy partition that separates the two sides of your nose.  A crooked or deviated septum may need repair if is obstructing the sinuses or nasal airflow.  Turbinate reduction is also often performed during sinus surgery.  The turbinates are bony proturberances from the side walls of the nose which swell and can obstruct the nose in patients with sinus and allergy problems.  Their size can be surgically reduced to help relieve nasal obstruction. ° °What Can Sinus Surgery Do For Me? ° Sinus surgery can reduce the frequency of sinus infections requiring antibiotic treatment.  This can provide improvement in nasal congestion, post-nasal drainage, facial pressure and nasal obstruction.  Surgery will NOT prevent you from ever having an infection again, so it usually only for patients who get infections 4 or more times yearly requiring antibiotics, or for infections that do not clear with antibiotics.  It will not cure nasal allergies, so patients with allergies may still require medication to treat their allergies after surgery. Surgery may improve headaches related to sinusitis, however, some people will continue to  require medication to control sinus headaches related to allergies.  Surgery will do nothing for other forms of headache (migraine, tension or cluster). ° °What Are the Risks of Endoscopic Sinus Surgery? ° Current techniques allow surgery to be performed safely with little risk, however, there are rare complications that patients should be aware of.  Because the sinuses are located around the eyes, there is risk of eye injury, including blindness, though again, this would be quite rare. This is usually a result of bleeding behind the eye during surgery, which puts the vision oat risk, though there are treatments to protect the vision and prevent permanent disrupted by surgery causing a leak of the spinal fluid that surrounds the brain.  More serious complications would include bleeding inside the brain cavity or damage to the brain.  Again, all of these complications are uncommon, and spinal fluid leaks can be safely managed surgically if they occur.  The most common complication of sinus surgery is bleeding from the nose, which may require packing or cauterization of the nose.  Continued sinus have polyps may experience recurrence of the polyps requiring revision surgery.  Alterations of sense of smell or injury to the tear ducts are also rare complications.  ° °What is the Surgery Like, and what is the Recovery? ° The Surgery usually takes a couple of hours to perform, and is usually performed under a general anesthetic (completely asleep).  Patients are usually discharged home after a couple of hours.  Sometimes during surgery it is necessary to pack the nose to control bleeding, and the packing is left in place for 24 - 48 hours, and removed by your surgeon.    If a septoplasty was performed during the procedure, there is often a splint placed which must be removed after 5-7 days.   °Discomfort: Pain is usually mild to moderate, and can be controlled by prescription pain medication or acetaminophen (Tylenol).   Aspirin, Ibuprofen (Advil, Motrin), or Naprosyn (Aleve) should be avoided, as they can cause increased bleeding.  Most patients feel sinus pressure like they have a bad head cold for several days.  Sleeping with your head elevated can help reduce swelling and facial pressure, as can ice packs over the face.  A humidifier may be helpful to keep the mucous and blood from drying in the nose.  ° °Diet: There are no specific diet restrictions, however, you should generally start with clear liquids and a light diet of bland foods because the anesthetic can cause some nausea.  Advance your diet depending on how your stomach feels.  Taking your pain medication with food will often help reduce stomach upset which pain medications can cause. ° °Nasal Saline Irrigation: It is important to remove blood clots and dried mucous from the nose as it is healing.  This is done by having you irrigate the nose at least 3 - 4 times daily with a salt water solution.  We recommend using NeilMed Sinus Rinse (available at the drug store).  Fill the squeeze bottle with the solution, bend over a sink, and insert the tip of the squeeze bottle into the nose ½ of an inch.  Point the tip of the squeeze bottle towards the inside corner of the eye on the same side your irrigating.  Squeeze the bottle and gently irrigate the nose.  If you bend forward as you do this, most of the fluid will flow back out of the nose, instead of down your throat.   The solution should be warm, near body temperature, when you irrigate.   Each time you irrigate, you should use a full squeeze bottle.  ° °Note that if you are instructed to use Nasal Steroid Sprays at any time after your surgery, irrigate with saline BEFORE using the steroid spray, so you do not wash it all out of the nose. °Another product, Nasal Saline Gel (such as AYR Nasal Saline Gel) can be applied in each nostril 3 - 4 times daily to moisture the nose and reduce scabbing or crusting. ° °Bleeding:   Bloody drainage from the nose can be expected for several days, and patients are instructed to irrigate their nose frequently with salt water to help remove mucous and blood clots.  The drainage may be dark red or brown, though some fresh blood may be seen intermittently, especially after irrigation.  Do not blow you nose, as bleeding may occur. If you must sneeze, keep your mouth open to allow air to escape through your mouth. ° °If heavy bleeding occurs: Irrigate the nose with saline to rinse out clots, then spray the nose 3 - 4 times with Afrin Nasal Decongestant Spray.  The spray will constrict the blood vessels to slow bleeding.  Pinch the lower half of your nose shut to apply pressure, and lay down with your head elevated.  Ice packs over the nose may help as well. If bleeding persists despite these measures, you should notify your doctor.  Do not use the Afrin routinely to control nasal congestion after surgery, as it can result in worsening congestion and may affect healing.  ° ° ° °Activity: Return to work varies among patients. Most patients will be   out of work at least 5 - 7 days to recover.  Patient may return to work after they are off of narcotic pain medication, and feeling well enough to perform the functions of their job.  Patients must avoid heavy lifting (over 10 pounds) or strenuous physical for 2 weeks after surgery, so your employer may need to assign you to light duty, or keep you out of work longer if light duty is not possible.  NOTE: you should not drive, operate dangerous machinery, do any mentally demanding tasks or make any important legal or financial decisions while on narcotic pain medication and recovering from the general anesthetic.  °  °Call Your Doctor Immediately if You Have Any of the Following: °1. Bleeding that you cannot control with the above measures °2. Loss of vision, double vision, bulging of the eye or black eyes. °3. Fever over 101 degrees °4. Neck stiffness with  severe headache, fever, nausea and change in mental state. °You are always encourage to call anytime with concerns, however, please call with requests for pain medication refills during office hours. ° °Office Endoscopy: During follow-up visits your doctor will remove any packing or splints that may have been placed and evaluate and clean your sinuses endoscopically.  Topical anesthetic will be used to make this as comfortable as possible, though you may want to take your pain medication prior to the visit.  How often this will need to be done varies from patient to patient.  After complete recovery from the surgery, you may need follow-up endoscopy from time to time, particularly if there is concern of recurrent infection or nasal polyps. ° ° °General Anesthesia, Adult, Care After °This sheet gives you information about how to care for yourself after your procedure. Your health care provider may also give you more specific instructions. If you have problems or questions, contact your health care provider. °What can I expect after the procedure? °After the procedure, the following side effects are common: °· Pain or discomfort at the IV site. °· Nausea. °· Vomiting. °· Sore throat. °· Trouble concentrating. °· Feeling cold or chills. °· Weak or tired. °· Sleepiness and fatigue. °· Soreness and body aches. These side effects can affect parts of the body that were not involved in surgery. °Follow these instructions at home: ° °For at least 24 hours after the procedure: °· Have a responsible adult stay with you. It is important to have someone help care for you until you are awake and alert. °· Rest as needed. °· Do not: °? Participate in activities in which you could fall or become injured. °? Drive. °? Use heavy machinery. °? Drink alcohol. °? Take sleeping pills or medicines that cause drowsiness. °? Make important decisions or sign legal documents. °? Take care of children on your own. °Eating and  drinking °· Follow any instructions from your health care provider about eating or drinking restrictions. °· When you feel hungry, start by eating small amounts of foods that are soft and easy to digest (bland), such as toast. Gradually return to your regular diet. °· Drink enough fluid to keep your urine pale yellow. °· If you vomit, rehydrate by drinking water, juice, or clear broth. °General instructions °· If you have sleep apnea, surgery and certain medicines can increase your risk for breathing problems. Follow instructions from your health care provider about wearing your sleep device: °? Anytime you are sleeping, including during daytime naps. °? While taking prescription pain medicines, sleeping medicines, or medicines   that make you drowsy. °· Return to your normal activities as told by your health care provider. Ask your health care provider what activities are safe for you. °· Take over-the-counter and prescription medicines only as told by your health care provider. °· If you smoke, do not smoke without supervision. °· Keep all follow-up visits as told by your health care provider. This is important. °Contact a health care provider if: °· You have nausea or vomiting that does not get better with medicine. °· You cannot eat or drink without vomiting. °· You have pain that does not get better with medicine. °· You are unable to pass urine. °· You develop a skin rash. °· You have a fever. °· You have redness around your IV site that gets worse. °Get help right away if: °· You have difficulty breathing. °· You have chest pain. °· You have blood in your urine or stool, or you vomit blood. °Summary °· After the procedure, it is common to have a sore throat or nausea. It is also common to feel tired. °· Have a responsible adult stay with you for the first 24 hours after general anesthesia. It is important to have someone help care for you until you are awake and alert. °· When you feel hungry, start by eating  small amounts of foods that are soft and easy to digest (bland), such as toast. Gradually return to your regular diet. °· Drink enough fluid to keep your urine pale yellow. °· Return to your normal activities as told by your health care provider. Ask your health care provider what activities are safe for you. °This information is not intended to replace advice given to you by your health care provider. Make sure you discuss any questions you have with your health care provider. °Document Released: 11/06/2000 Document Revised: 08/03/2017 Document Reviewed: 03/16/2017 °Elsevier Patient Education © 2020 [employer] ° °

## 2019-05-19 ENCOUNTER — Other Ambulatory Visit: Payer: Self-pay

## 2019-05-19 ENCOUNTER — Other Ambulatory Visit
Admission: RE | Admit: 2019-05-19 | Discharge: 2019-05-19 | Disposition: A | Discharge status: Home / Self Care | Payer: BC Managed Care – PPO | Source: Ambulatory Visit | Attending: Otolaryngology | Admitting: Otolaryngology

## 2019-05-19 DIAGNOSIS — Z20828 Contact with and (suspected) exposure to other viral communicable diseases: Secondary | ICD-10-CM | POA: Insufficient documentation

## 2019-05-19 DIAGNOSIS — Z01818 Encounter for other preprocedural examination: Secondary | ICD-10-CM | POA: Diagnosis present

## 2019-05-19 LAB — SARS CORONAVIRUS 2 (TAT 6-24 HRS): SARS Coronavirus 2: NEGATIVE

## 2019-05-21 NOTE — Anesthesia Preprocedure Evaluation (Addendum)
Anesthesia Evaluation  Patient identified by MRN, date of birth, ID band Patient awake    Reviewed: Allergy & Precautions, NPO status , Patient's Chart, lab work & pertinent test results  History of Anesthesia Complications Negative for: history of anesthetic complications  Airway Mallampati: II  TM Distance: >3 FB Neck ROM: Full    Dental   Pulmonary asthma (Exercise-induced) ,    breath sounds clear to auscultation       Cardiovascular (-) angina(-) DOE  Rhythm:Regular Rate:Normal     Neuro/Psych  Headaches,  Neuromuscular disease (Cervical spine disease)    GI/Hepatic neg GERD  , IBS   Endo/Other  diabetes (Pre-DM) Pituitary microadenoma w/ hyperprolactinemia, treated w/ Parlodel  Renal/GU      Musculoskeletal  (+) Arthritis ,   Abdominal   Peds  Hematology   Anesthesia Other Findings   Reproductive/Obstetrics                            Anesthesia Physical Anesthesia Plan  ASA: II  Anesthesia Plan: General   Post-op Pain Management:    Induction: Intravenous  PONV Risk Score and Plan: 3 and Ondansetron, Dexamethasone and Midazolam  Airway Management Planned: Oral ETT  Additional Equipment:   Intra-op Plan:   Post-operative Plan: Extubation in OR  Informed Consent: I have reviewed the patients History and Physical, chart, labs and discussed the procedure including the risks, benefits and alternatives for the proposed anesthesia with the patient or authorized representative who has indicated his/her understanding and acceptance.       Plan Discussed with: CRNA and Anesthesiologist  Anesthesia Plan Comments:        Anesthesia Quick Evaluation

## 2019-05-22 ENCOUNTER — Ambulatory Visit: Payer: BC Managed Care – PPO | Admitting: Anesthesiology

## 2019-05-22 ENCOUNTER — Ambulatory Visit
Admission: RE | Admit: 2019-05-22 | Discharge: 2019-05-22 | Disposition: A | Discharge status: Home / Self Care | Payer: BC Managed Care – PPO | Attending: Otolaryngology | Admitting: Otolaryngology

## 2019-05-22 ENCOUNTER — Other Ambulatory Visit: Payer: Self-pay

## 2019-05-22 ENCOUNTER — Encounter
Admission: RE | Disposition: A | Discharge status: Home / Self Care | Payer: Self-pay | Source: Home / Self Care | Attending: Otolaryngology

## 2019-05-22 DIAGNOSIS — M1990 Unspecified osteoarthritis, unspecified site: Secondary | ICD-10-CM | POA: Diagnosis not present

## 2019-05-22 DIAGNOSIS — J342 Deviated nasal septum: Secondary | ICD-10-CM | POA: Insufficient documentation

## 2019-05-22 DIAGNOSIS — J343 Hypertrophy of nasal turbinates: Secondary | ICD-10-CM | POA: Insufficient documentation

## 2019-05-22 DIAGNOSIS — J329 Chronic sinusitis, unspecified: Secondary | ICD-10-CM | POA: Diagnosis not present

## 2019-05-22 DIAGNOSIS — Z79899 Other long term (current) drug therapy: Secondary | ICD-10-CM | POA: Insufficient documentation

## 2019-05-22 DIAGNOSIS — J45909 Unspecified asthma, uncomplicated: Secondary | ICD-10-CM | POA: Insufficient documentation

## 2019-05-22 HISTORY — PX: TURBINATE REDUCTION: SHX6157

## 2019-05-22 HISTORY — DX: Prediabetes: R73.03

## 2019-05-22 HISTORY — PX: SEPTOPLASTY: SHX2393

## 2019-05-22 HISTORY — DX: Migraine, unspecified, not intractable, without status migrainosus: G43.909

## 2019-05-22 HISTORY — DX: Unspecified osteoarthritis, unspecified site: M19.90

## 2019-05-22 SURGERY — SEPTOPLASTY, NOSE
Anesthesia: General | Site: Nose | Laterality: Bilateral

## 2019-05-22 MED ORDER — HYDROCODONE-ACETAMINOPHEN 5-325 MG PO TABS: 1.0000 | ORAL_TABLET | ORAL | 0 refills | Status: AC | PRN

## 2019-05-22 MED ORDER — GLYCOPYRROLATE 0.2 MG/ML IJ SOLN
INTRAMUSCULAR | Status: DC | PRN
  Administered 2019-05-22: 0.1 mg via INTRAVENOUS

## 2019-05-22 MED ORDER — EPHEDRINE SULFATE 50 MG/ML IJ SOLN
INTRAMUSCULAR | Status: DC | PRN
  Administered 2019-05-22: 5 mg via INTRAVENOUS
  Administered 2019-05-22: 10 mg via INTRAVENOUS
  Administered 2019-05-22: 5 mg via INTRAVENOUS

## 2019-05-22 MED ORDER — ONDANSETRON HCL 4 MG/2ML IJ SOLN
INTRAMUSCULAR | Status: DC | PRN
  Administered 2019-05-22: 4 mg via INTRAVENOUS

## 2019-05-22 MED ORDER — PROMETHAZINE HCL 25 MG/ML IJ SOLN: 6.2500 mg | INTRAMUSCULAR | Status: DC | PRN

## 2019-05-22 MED ORDER — OXYCODONE HCL 5 MG/5ML PO SOLN
5.0000 mg | Freq: Once | ORAL | Status: AC | PRN
  Administered 2019-05-22: 09:00:00 5 mg via ORAL

## 2019-05-22 MED ORDER — HYDROMORPHONE HCL 1 MG/ML IJ SOLN
0.2500 mg | INTRAMUSCULAR | Status: DC | PRN
  Administered 2019-05-22 (×4): 0.25 mg via INTRAVENOUS

## 2019-05-22 MED ORDER — SUCCINYLCHOLINE CHLORIDE 20 MG/ML IJ SOLN
INTRAMUSCULAR | Status: DC | PRN
  Administered 2019-05-22: 80 mg via INTRAVENOUS

## 2019-05-22 MED ORDER — PREDNISONE 10 MG PO TABS: ORAL_TABLET | ORAL | 0 refills | Status: DC

## 2019-05-22 MED ORDER — PROPOFOL 10 MG/ML IV BOLUS
INTRAVENOUS | Status: DC | PRN
  Administered 2019-05-22: 120 mg via INTRAVENOUS

## 2019-05-22 MED ORDER — OXYCODONE HCL 5 MG PO TABS: 5.0000 mg | ORAL_TABLET | Freq: Once | ORAL | Status: AC | PRN

## 2019-05-22 MED ORDER — FENTANYL CITRATE (PF) 100 MCG/2ML IJ SOLN
INTRAMUSCULAR | Status: DC | PRN
  Administered 2019-05-22: 50 ug via INTRAVENOUS

## 2019-05-22 MED ORDER — LIDOCAINE-EPINEPHRINE 1 %-1:100000 IJ SOLN
INTRAMUSCULAR | Status: DC | PRN
  Administered 2019-05-22: 6 mL

## 2019-05-22 MED ORDER — MEPERIDINE HCL 25 MG/ML IJ SOLN: 6.2500 mg | INTRAMUSCULAR | Status: DC | PRN

## 2019-05-22 MED ORDER — PHENYLEPHRINE HCL 0.5 % NA SOLN
NASAL | Status: DC | PRN
  Administered 2019-05-22: 30 mL via TOPICAL

## 2019-05-22 MED ORDER — OXYMETAZOLINE HCL 0.05 % NA SOLN
2.0000 | Freq: Once | NASAL | Status: AC
  Administered 2019-05-22: 07:00:00 2 via NASAL

## 2019-05-22 MED ORDER — LACTATED RINGERS IV SOLN
10.0000 mL/h | INTRAVENOUS | Status: DC
  Administered 2019-05-22: 10 mL/h via INTRAVENOUS

## 2019-05-22 MED ORDER — MIDAZOLAM HCL 5 MG/5ML IJ SOLN
INTRAMUSCULAR | Status: DC | PRN
  Administered 2019-05-22: 2 mg via INTRAVENOUS

## 2019-05-22 MED ORDER — DEXTROSE 5 % IV SOLN
2000.0000 mg | Freq: Once | INTRAVENOUS | Status: AC
  Administered 2019-05-22: 07:00:00 2000 mg via INTRAVENOUS

## 2019-05-22 MED ORDER — LIDOCAINE HCL (CARDIAC) PF 100 MG/5ML IV SOSY
PREFILLED_SYRINGE | INTRAVENOUS | Status: DC | PRN
  Administered 2019-05-22: 50 mg via INTRAVENOUS

## 2019-05-22 MED ORDER — DEXAMETHASONE SODIUM PHOSPHATE 4 MG/ML IJ SOLN
INTRAMUSCULAR | Status: DC | PRN
  Administered 2019-05-22: 10 mg via INTRAVENOUS

## 2019-05-22 MED ORDER — CEPHALEXIN 500 MG PO CAPS: 500.0000 mg | ORAL_CAPSULE | Freq: Two times a day (BID) | ORAL | 0 refills | Status: DC

## 2019-05-22 SURGICAL SUPPLY — 27 items
CANISTER SUCT 1200ML W/VALVE (MISCELLANEOUS) ×2 IMPLANT
COAGULATOR SUCT 8FR VV (MISCELLANEOUS) ×2 IMPLANT
ELECT REM PT RETURN 9FT ADLT (ELECTROSURGICAL) ×2
ELECTRODE REM PT RTRN 9FT ADLT (ELECTROSURGICAL) ×1 IMPLANT
GLOVE INDICATOR 6.5 STRL GRN (GLOVE) ×1 IMPLANT
GLOVE PI ULTRA LF STRL 7.5 (GLOVE) ×2 IMPLANT
GLOVE PI ULTRA NON LATEX 7.5 (GLOVE) ×2
GOWN STRL REUS W/ TWL LRG LVL3 (GOWN DISPOSABLE) ×1 IMPLANT
GOWN STRL REUS W/TWL LRG LVL3 (GOWN DISPOSABLE) ×1
KIT TURNOVER KIT A (KITS) ×2 IMPLANT
NDL ANESTHESIA 27G X 3.5 (NEEDLE) ×1 IMPLANT
NDL HYPO 27GX1-1/4 (NEEDLE) ×1 IMPLANT
NEEDLE ANESTHESIA  27G X 3.5 (NEEDLE) ×1
NEEDLE ANESTHESIA 27G X 3.5 (NEEDLE) ×1 IMPLANT
NEEDLE HYPO 27GX1-1/4 (NEEDLE) ×2 IMPLANT
PACK ENT CUSTOM (PACKS) ×2 IMPLANT
PATTIES SURGICAL .5 X3 (DISPOSABLE) ×2 IMPLANT
SPLINT NASAL SEPTAL BLV .50 ST (MISCELLANEOUS) ×2 IMPLANT
STRAP BODY AND KNEE 60X3 (MISCELLANEOUS) ×2 IMPLANT
SUT CHROMIC 3-0 (SUTURE) ×1
SUT CHROMIC 3-0 KS 27XMFL CR (SUTURE) ×1
SUT ETHILON 3-0 KS 30 BLK (SUTURE) ×2 IMPLANT
SUT PLAIN GUT 4-0 (SUTURE) ×3 IMPLANT
SUTURE CHRMC 3-0 KS 27XMFL CR (SUTURE) IMPLANT
SYR 3ML LL SCALE MARK (SYRINGE) ×2 IMPLANT
TOWEL OR 17X26 4PK STRL BLUE (TOWEL DISPOSABLE) ×2 IMPLANT
WATER STERILE IRR 250ML POUR (IV SOLUTION) ×2 IMPLANT

## 2019-05-22 NOTE — Anesthesia Procedure Notes (Signed)
Procedure Name: Intubation Date/Time: 05/22/2019 7:49 AM Performed by: Mayme Genta, CRNA Pre-anesthesia Checklist: Patient identified, Emergency Drugs available, Suction available, Patient being monitored and Timeout performed Patient Re-evaluated:Patient Re-evaluated prior to induction Oxygen Delivery Method: Circle system utilized Preoxygenation: Pre-oxygenation with 100% oxygen Induction Type: IV induction Ventilation: Mask ventilation without difficulty Laryngoscope Size: Miller and 2 Grade View: Grade I Tube type: Oral Rae Tube size: 7.0 mm Number of attempts: 1 Placement Confirmation: ETT inserted through vocal cords under direct vision,  positive ETCO2 and breath sounds checked- equal and bilateral Tube secured with: Tape Dental Injury: Teeth and Oropharynx as per pre-operative assessment

## 2019-05-22 NOTE — Transfer of Care (Signed)
Immediate Anesthesia Transfer of Care Note  Patient: Jo Ryan  Procedure(s) Performed: SEPTOPLASTY (Bilateral Nose) TURBINATE REDUCTION (Bilateral Nose)  Patient Location: PACU  Anesthesia Type: General  Level of Consciousness: awake, alert  and patient cooperative  Airway and Oxygen Therapy: Patient Spontanous Breathing and Patient connected to supplemental oxygen  Post-op Assessment: Post-op Vital signs reviewed, Patient's Cardiovascular Status Stable, Respiratory Function Stable, Patent Airway and No signs of Nausea or vomiting  Post-op Vital Signs: Reviewed and stable  Complications: No apparent anesthesia complications

## 2019-05-22 NOTE — Anesthesia Postprocedure Evaluation (Signed)
Anesthesia Post Note  Patient: Phillippa Lorang  Procedure(s) Performed: SEPTOPLASTY (Bilateral Nose) TURBINATE REDUCTION (Bilateral Nose)  Patient location during evaluation: PACU Anesthesia Type: General Level of consciousness: awake and alert Pain management: pain level controlled Vital Signs Assessment: post-procedure vital signs reviewed and stable Respiratory status: spontaneous breathing, nonlabored ventilation, respiratory function stable and patient connected to nasal cannula oxygen Cardiovascular status: blood pressure returned to baseline and stable Postop Assessment: no apparent nausea or vomiting Anesthetic complications: no    Carlena Ruybal A  Gustave Lindeman

## 2019-05-22 NOTE — Op Note (Signed)
05/22/2019  8:54 AM  [id number]   Pre-Op Dx:  Deviated Nasal Septum, Hypertrophic Inferior Turbinates  Post-op Dx: Same  Proc: Nasal Septoplasty, Bilateral Partial Reduction Inferior Turbinates   Surg:  Jo Ryan  Anes:  GOT  EBL: 50 mL  Comp: None  Findings: Septum had a large spur to the left side superiorly it was pushing into the middle turbinate.  The inferior turbinates were enlarged.  Procedure: With the patient in a comfortable supine position,  general orotracheal anesthesia was induced without difficulty.     The patient received preoperative Afrin spray for topical decongestion and vasoconstriction.  Intravenous prophylactic antibiotics were administered.  At an appropriate level, the patient was placed in a semi-sitting position.  Nasal vibrissae were trimmed.   1% Xylocaine with 1:100,000 epinephrine, 6 cc's, was infiltrated into the anterior floor of the nose, into the nasal spine region, into the membranous columella, and finally into the submucoperichondrial plane of the septum on both sides.  Several minutes were allowed for this to take effect.  Cottoniod pledgetts soaked in Afrin and 4% Xylocaine were placed into both nasal cavities and left while the patient was prepped and draped in the standard fashion.  The materials were removed from the nose and observed to be intact and correct in number.  The nose was inspected with a headlight and zero degree scope with the findings as described above.  A left Killian incision was sharply executed and carried down to the quadrangular cartilage. The mucoperichondrium was elelvated along the quadrangular plate back to the bony-cartilaginous junction. The mucoperiostium was then elevated along the ethmoid plate and the vomer. The boney-catilaginous junction was then split with a freer elevator and the mucoperiosteum was elevated on the opposite side. The mucoperiosteum was then elevated along the maxillary crest as needed to  expose the crooked bone of the crest.  Boney spurs of the vomer and maxillary crest were removed with Donavan Foil forceps.  The cartilaginous plate was trimmed along its posterior and inferior borders of about 2 mm of cartilage to free it up inferiorly. Some of the deviated ethmoid plate was then fractured and removed with Takahashi forceps to free up the posterior border of the quadrangular plate and allow it to swing back to the midline. The mucosal flaps were placed back into their anatomic position to allow visualization of the airways. The septum now sat in the midline with an improved airway.  A 3-0 Chromic suture on a Keith needle in used to anchor the inferior septum at the nasal spine with a through and through suture. The mucosal flaps are then sutured together using a through and through whip stitch of 4-0 Plain Gut with a mini-Keith needle. This was used to close the [place] incision as well.   The inferior turbinates were then inspected. An incision was created along the inferior aspect of the left inferior turbinate with removal of some of the inferior soft tissue and bone. Electrocautery was used to control bleeding in the area. The remaining turbinate was then outfractured to open up the airway further. There was no significant bleeding noted. The right turbinate was then trimmed and outfractured in a similar fashion.  The airways were then visualized and showed open passageways on both sides that were significantly improved compared to before surgery. There was no signifcant bleeding. Nasal splints were applied to both sides of the septum using Xomed 0.91mm regular sized splints that were trimmed, and then held in position with a 3-0  Nylon through and through suture.  The patient was turned back over to anesthesia, and awakened, extubated, and taken to the PACU in satisfactory condition.  Dispo:   PACU to home  Plan: Ice, elevation, narcotic analgesia, steroid taper, and prophylactic  antibiotics for the duration of indwelling nasal foreign bodies.  We will reevaluate the patient in the office in 6 days and remove the septal splints.  Return to work in 10 days, strenuous activities in two weeks.   Jo Ryan 05/22/2019 8:54 AM

## 2019-05-22 NOTE — H&P (Signed)
H&P has been reviewed and patient reevaluated, no changes necessary. To be downloaded later.  

## 2020-04-14 DIAGNOSIS — M810 Age-related osteoporosis without current pathological fracture: Secondary | ICD-10-CM | POA: Insufficient documentation

## 2021-09-13 ENCOUNTER — Other Ambulatory Visit: Payer: Self-pay | Admitting: Family Medicine

## 2021-09-13 DIAGNOSIS — R1031 Right lower quadrant pain: Secondary | ICD-10-CM

## 2021-09-13 DIAGNOSIS — K219 Gastro-esophageal reflux disease without esophagitis: Secondary | ICD-10-CM | POA: Insufficient documentation

## 2021-09-15 DIAGNOSIS — K9041 Non-celiac gluten sensitivity: Secondary | ICD-10-CM | POA: Insufficient documentation

## 2021-09-15 DIAGNOSIS — R140 Abdominal distension (gaseous): Secondary | ICD-10-CM | POA: Insufficient documentation

## 2021-09-15 DIAGNOSIS — R1031 Right lower quadrant pain: Secondary | ICD-10-CM | POA: Insufficient documentation

## 2021-09-15 DIAGNOSIS — Z8371 Family history of colonic polyps: Secondary | ICD-10-CM | POA: Insufficient documentation

## 2021-09-19 ENCOUNTER — Other Ambulatory Visit: Payer: Self-pay | Admitting: Family Medicine

## 2021-09-19 ENCOUNTER — Other Ambulatory Visit: Payer: Self-pay

## 2021-09-19 ENCOUNTER — Telehealth: Payer: Self-pay | Admitting: Family Medicine

## 2021-09-19 ENCOUNTER — Ambulatory Visit
Admission: RE | Admit: 2021-09-19 | Discharge: 2021-09-19 | Disposition: A | Discharge status: Home / Self Care | Payer: Medicare PPO | Source: Ambulatory Visit | Attending: Family Medicine | Admitting: Family Medicine

## 2021-09-19 DIAGNOSIS — R1031 Right lower quadrant pain: Secondary | ICD-10-CM

## 2021-09-19 NOTE — Telephone Encounter (Signed)
09/19/21-Rcvd call from [facility] Rep/[employer] H & patient ref NO Auth. Per rep, Retro-Auth can be submitted for today. Rep will call ref MD to make aware of CT not AUTH.  Ref # [id number]. Patient drank prep. Email sent to ref office via AP.

## 2021-10-11 DIAGNOSIS — G43909 Migraine, unspecified, not intractable, without status migrainosus: Secondary | ICD-10-CM | POA: Insufficient documentation

## 2021-10-13 DIAGNOSIS — I700 Atherosclerosis of aorta: Secondary | ICD-10-CM | POA: Insufficient documentation

## 2021-10-24 ENCOUNTER — Other Ambulatory Visit: Payer: Self-pay

## 2021-10-24 ENCOUNTER — Ambulatory Visit (INDEPENDENT_AMBULATORY_CARE_PROVIDER_SITE_OTHER): Payer: Medicare PPO

## 2021-10-24 ENCOUNTER — Encounter: Payer: Self-pay | Admitting: Podiatry

## 2021-10-24 ENCOUNTER — Ambulatory Visit: Payer: Medicare PPO | Admitting: Podiatry

## 2021-10-24 DIAGNOSIS — M722 Plantar fascial fibromatosis: Secondary | ICD-10-CM | POA: Diagnosis not present

## 2021-10-24 MED ORDER — MELOXICAM 15 MG PO TABS: 15.0000 mg | ORAL_TABLET | Freq: Every day | ORAL | 3 refills | Status: AC

## 2021-10-24 MED ORDER — TRIAMCINOLONE ACETONIDE 40 MG/ML IJ SUSP
20.0000 mg | Freq: Once | INTRAMUSCULAR | Status: AC
  Administered 2021-10-24: 20 mg

## 2021-10-24 MED ORDER — METHYLPREDNISOLONE 4 MG PO TBPK: ORAL_TABLET | ORAL | 0 refills | Status: DC

## 2021-10-24 NOTE — Patient Instructions (Signed)

## 2021-10-24 NOTE — Progress Notes (Signed)
?Subjective:  ?Patient ID: Jo Ryan, female    DOB: 03-11-1956,  MRN: [id number] ?HPI ?Chief Complaint  ?Patient presents with  ? Foot Pain  ?  Plantar/medial heel right - aching x couple weeks, AM pain, tried aleve, rest, rolling frozen bottle, heating pad  ? New Patient (Initial Visit)  ?  Est pt 2018  ? ? ?66 y.o. female presents with the above complaint.  ? ?ROS: Denies fever chills nausea vomiting muscle aches pains calf pain back pain chest pain shortness of breath. ? ?Past Medical History:  ?Diagnosis Date  ? Arthritis   ? Asthma   ? History of migraine headaches   ? Migraines   ? Neuromuscular disorder ([place])   ? cervical disk disease  ? 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 treated with Parlodel  ? Prediabetes   ? Vitamin D deficiency   ? ?Past Surgical History:  ?Procedure Laterality Date  ? ABDOMINAL HYSTERECTOMY    ? age 31  ? KNEE ARTHROSCOPY Left   ? x 3  ? KNEE ARTHROSCOPY Right   ? x 1  ? SEPTOPLASTY Bilateral 05/22/2019  ? Procedure: SEPTOPLASTY;  Surgeon: Margaretha Sheffield, MD;  Location: [place];  Service: ENT;  Laterality: Bilateral;  ? TURBINATE REDUCTION Bilateral 05/22/2019  ? Procedure: TURBINATE REDUCTION;  Surgeon: Margaretha Sheffield, MD;  Location: [place];  Service: ENT;  Laterality: Bilateral;  ? ? ?Current Outpatient Medications:  ?  meloxicam (MOBIC) 15 MG tablet, Take 1 tablet (15 mg total) by mouth daily., Disp: 30 tablet, Rfl: 3 ?  methylPREDNISolone (MEDROL DOSEPAK) 4 MG TBPK tablet, 6 day dose pack - take as directed, Disp: 21 tablet, Rfl: 0 ?  acetaminophen (TYLENOL) 325 MG tablet, Take 650 mg by mouth every 6 (six) hours as needed., Disp: , Rfl:  ?  albuterol (PROVENTIL HFA;VENTOLIN HFA) 108 (90 BASE) MCG/ACT inhaler, Inhale 2 puffs into the lungs every 6 (six) hours as needed for wheezing., Disp: 1 Inhaler, Rfl: 2 ?  alendronate (FOSAMAX) 70 MG tablet, Take 70 mg by mouth once a week., Disp: , Rfl:  ?  calcium citrate-vitamin D  (CITRACAL+D) 315-200 MG-UNIT tablet, Take 1 tablet by mouth 2 (two) times daily., Disp: , Rfl:  ?  LINZESS 145 MCG CAPS capsule, Take 145 mcg by mouth daily., Disp: , Rfl:  ?  pantoprazole (PROTONIX) 40 MG tablet, Take 40 mg by mouth daily., Disp: , Rfl:  ?  propranolol (INDERAL) 40 MG tablet, Take 40 mg by mouth 2 (two) times daily., Disp: , Rfl:  ?  SUMAtriptan (IMITREX) 50 MG tablet, Take by mouth., Disp: , Rfl:  ?  traZODone (DESYREL) 50 MG tablet, Take 1 tablet (50 mg total) by mouth at bedtime., Disp: 30 tablet, Rfl: 3 ?  triamcinolone (NASACORT ALLERGY 24HR) 55 MCG/ACT AERO nasal inhaler, Place 2 sprays into the nose daily., Disp: , Rfl:  ? ?Allergies  ?Allergen Reactions  ? Gluten   ?  Wheat,  potatoes, tomatoes and corn by allergy testing all cause bloating  ? Iodine Hives and Itching  ?  Per pt she has hx of IV contrast allergy at [facility] 10 years ago. Pt reaction was itching and hives MSY   ? Shellfish Allergy   ? Wheat Bran Swelling  ?  Abd swelling (in large amounts)  ? ?Review of Systems ?Objective:  ?There were no vitals filed for this visit. ? ?General: Well developed, nourished, in no acute distress, alert and oriented x3  ? ?  Dermatological: Skin is warm, dry and supple bilateral. Nails x 10 are well maintained; remaining integument appears unremarkable at this time. There are no open sores, no preulcerative lesions, no rash or signs of infection present. ? ?Vascular: Dorsalis Pedis artery and Posterior Tibial artery pedal pulses are 2/4 bilateral with immedate capillary fill time. Pedal hair growth present. No varicosities and no lower extremity edema present bilateral.  ? ?Neruologic: Grossly intact via light touch bilateral. Vibratory intact via tuning fork bilateral. Protective threshold with Semmes Wienstein monofilament intact to all pedal sites bilateral. Patellar and Achilles deep tendon reflexes 2+ bilateral. No Babinski or clonus noted bilateral.  ? ?Musculoskeletal: No gross boney pedal  deformities bilateral. No pain, crepitus, or limitation noted with foot and ankle range of motion bilateral. Muscular strength 5/5 in all groups tested bilateral.  Pain on palpation medial calcaneal tubercle of the right heel.  No pain on medial lateral compression of the calcaneus. ? ?Gait: Unassisted, Nonantalgic.  ? ? ?Radiographs: ? ?Radiographs taken today demonstrate mild osteopenia small plantar distally oriented calcaneal heel spur as well as posterior calcaneal heel spur.  No other significant osseous abnormalities.  Soft tissue increase in density plantar fascial Caney insertion site consistent with fasciitis. ? ?Assessment & Plan:  ? ?Assessment: Planter fasciitis right. ? ?Plan: Discussed etiology pathology and surgical therapies injected the medial heel.  20 mg Kenalog 5 mg Marcaine.  Start her Medrol Dosepak to be followed by meloxicam.  Discussed appropriate shoe gear twitching exercise ice therapy and sugar modifications.  Provided her with a plantar fascia brace and I like to follow-up with her in 1 month ? ? ? ? ?Aretha Levi T. [place], DPM ?

## 2021-11-30 ENCOUNTER — Encounter: Payer: Self-pay | Admitting: Podiatry

## 2021-11-30 ENCOUNTER — Ambulatory Visit: Payer: Medicare PPO | Admitting: Podiatry

## 2021-11-30 DIAGNOSIS — M722 Plantar fascial fibromatosis: Secondary | ICD-10-CM | POA: Diagnosis not present

## 2021-11-30 NOTE — Progress Notes (Signed)
She presents today for follow-up of her Planter fasciitis of her right foot.  States that is doing really good no pain whatsoever states that about a week after the injection and the medication it has been 100% ever since that time.  She states that she completed her Medrol Dosepak and her meloxicam and has not been on the meloxicam. ? ?Objective: Vital signs are stable she is alert and oriented x3 presents in her tennis shoes today once removed palpation demonstrates no pain on palpation medial calcaneal tubercle of the right heel.  Pulses are strong and palpable.  No open lesions or wounds. ? ?Assessment: Resolving Planter fasciitis at 100% right. ? ?Plan: Encouraged her to continue all conservative therapies including medication and shoe gear and I will follow-up with her on an as-needed basis. ?

## 2022-01-23 ENCOUNTER — Encounter: Payer: Self-pay | Admitting: Emergency Medicine

## 2022-01-23 ENCOUNTER — Other Ambulatory Visit: Payer: Self-pay

## 2022-01-23 ENCOUNTER — Emergency Department: Payer: Medicare PPO

## 2022-01-23 ENCOUNTER — Emergency Department
Admission: EM | Admit: 2022-01-23 | Discharge: 2022-01-23 | Disposition: A | Discharge status: Home / Self Care | Payer: Medicare PPO | Attending: Emergency Medicine | Admitting: Emergency Medicine

## 2022-01-23 DIAGNOSIS — J45909 Unspecified asthma, uncomplicated: Secondary | ICD-10-CM | POA: Insufficient documentation

## 2022-01-23 DIAGNOSIS — S0990XA Unspecified injury of head, initial encounter: Secondary | ICD-10-CM | POA: Diagnosis present

## 2022-01-23 DIAGNOSIS — S060X0A Concussion without loss of consciousness, initial encounter: Secondary | ICD-10-CM | POA: Insufficient documentation

## 2022-01-23 DIAGNOSIS — S0101XA Laceration without foreign body of scalp, initial encounter: Secondary | ICD-10-CM | POA: Diagnosis not present

## 2022-01-23 DIAGNOSIS — Y92009 Unspecified place in unspecified non-institutional (private) residence as the place of occurrence of the external cause: Secondary | ICD-10-CM | POA: Insufficient documentation

## 2022-01-23 DIAGNOSIS — W01198A Fall on same level from slipping, tripping and stumbling with subsequent striking against other object, initial encounter: Secondary | ICD-10-CM | POA: Diagnosis not present

## 2022-01-23 MED ORDER — ACETAMINOPHEN 325 MG PO TABS
650.0000 mg | ORAL_TABLET | Freq: Once | ORAL | Status: AC
  Administered 2022-01-23: 650 mg via ORAL
  Filled 2022-01-23: qty 2

## 2022-01-23 MED ORDER — LIDOCAINE-EPINEPHRINE 2 %-1:100000 IJ SOLN
20.0000 mL | Freq: Once | INTRAMUSCULAR | Status: AC
  Administered 2022-01-23: 20 mL via INTRADERMAL
  Filled 2022-01-23: qty 1

## 2022-01-23 MED ORDER — ONDANSETRON 4 MG PO TBDP
4.0000 mg | ORAL_TABLET | Freq: Once | ORAL | Status: AC
  Administered 2022-01-23: 4 mg via ORAL
  Filled 2022-01-23: qty 1

## 2022-01-23 MED ORDER — IBUPROFEN 600 MG PO TABS
600.0000 mg | ORAL_TABLET | ORAL | Status: AC
  Administered 2022-01-23: 600 mg via ORAL
  Filled 2022-01-23: qty 1

## 2022-01-23 NOTE — Discharge Instructions (Signed)
You have 7 (seven) staples, they need removal in about 10 days.  You were seen in the Emergency Department (ED) today for a head injury.  Based on your evaluation, you may have sustained a concussion (or bruise) to your brain.  If you had a CT scan done, it did not show any evidence of serious injury or bleeding.    Symptoms to expect from a concussion include nausea, mild to moderate headache, difficulty concentrating or sleeping, and mild lightheadedness.  These symptoms should improve over the next few days to weeks, but it may take many weeks before you feel back to normal.  Return to the emergency department or follow-up with your primary care doctor if your symptoms are not improving over this time.  Signs of a more serious head injury include vomiting, severe headache, excessive sleepiness or confusion, and weakness or numbness in your face, arms or legs.  Return immediately to the Emergency Department if you experience any of these more concerning symptoms.

## 2022-01-23 NOTE — ED Triage Notes (Signed)
Pt via POV from home. Pt fell, mechanical and the back of her head hit the dining room table. Denies blood thinners. Denies LOC. Pt c/o headache and R forearm pain trying to brace her fall. Laceration noted to posterior aspect of the head, bleeding controlled. Pt is A&OX4 and NAD

## 2022-01-23 NOTE — ED Provider Notes (Signed)
[facility] Provider Note    Event Date/Time   First MD Initiated Contact with Patient 01/23/22 1532     (approximate)   History   Fall and Head Injury   HPI  Jo Ryan is a 66 y.o. female with a history of asthma migraines prediabetes  She was in her normal health.  Clear home.  Reports her feet got tangled up as she was cleaning and she fell backwards striking the back of her head on a coffee table.  There was immediate bleeding.  She did not lose consciousness.  She also reports she feels like she bruised her right forearm and it was sore but seems to be feeling better now.  She reports "it is definitely not broken"  Otherwise she has been in her normal health.  No chest pain no trouble breathing no other injuries.  No neck pain.  Reports some mild headache mostly over the back of the scalp.  And saw "flashes of light" very briefly after the fall but that has resolved     Physical Exam   Triage Vital Signs: ED Triage Vitals  Enc Vitals Group     BP 01/23/22 1357 (!) 151/71     Pulse Rate 01/23/22 1357 (!) 59     Resp 01/23/22 1357 20     Temp 01/23/22 1357 98.5 F (36.9 C)     Temp Source 01/23/22 1357 Oral     SpO2 01/23/22 1357 100 %     Weight 01/23/22 1355 145 lb (65.8 kg)     Height 01/23/22 1355 '5\' 3"'$  (1.6 m)     Head Circumference --      Peak Flow --      Pain Score 01/23/22 1355 7     Pain Loc --      Pain Edu? --      Excl. in [place]? --     Most recent vital signs: Vitals:   01/23/22 1357  BP: (!) 151/71  Pulse: (!) 59  Resp: 20  Temp: 98.5 F (36.9 C)  SpO2: 100%     General: Awake, no distress.  She and her husband both very pleasant CV:  Good peripheral perfusion.  Regular, strong right radial pulse well-perfused right hand Resp:  Normal effort.  Abd:  No distention.  Other:  No cervical tenderness.   RIGHT Right upper extremity demonstrates normal strength, good use of all muscles. No edema bruising  right shoulder/upper arm, right elbow, right forearm / hand.  However she does report soreness to palpation of the soft tissues of the proximal right volar forearm without deformity.  Full range of motion of the right right upper extremity but reports it just feels "sore". No evidence of major trauma. Strong radial pulse. Intact median/ulnar/radial neuro-muscular exam.   Normocephalic atraumatic except over the occiput there is approximately 1 inch long linear laceration with bleeding controlled without evidence of foreign body or contamination   Patient reports her tetanus should be up-to-date as she keeps regular with her primary doctor on immunizations   ED Results / Procedures / Treatments   Labs (all labs ordered are listed, but only abnormal results are displayed) Labs Reviewed - No data to display   EKG     RADIOLOGY   Personally interpreted imaging of the right forearm is negative for acute fracture    CT Head Wo Contrast  IMPRESSION: No acute intracranial abnormality seen. Moderate multilevel degenerative disc disease is noted the cervical  spine. No acute abnormality is noted. Electronically Signed   By: Marijo Conception M.D.   On: 01/23/2022 14:34   CT Cervical Spine Wo Contrast  Result Date: 01/23/2022 CLINICAL DATA:  Head injury after fall. EXAM: CT HEAD WITHOUT CONTRAST CT CERVICAL SPINE WITHOUT CONTRAST TECHNIQUE: Multidetector CT imaging of the head and cervical spine was performed following the standard protocol without intravenous contrast. Multiplanar CT image reconstructions of the cervical spine were also generated. RADIATION DOSE REDUCTION: This exam was performed according to the departmental dose-optimization program which includes automated exposure control, adjustment of the mA and/or kV according to patient size and/or use of iterative reconstruction technique. COMPARISON:  None Available. FINDINGS: CT HEAD FINDINGS Brain: No evidence of acute infarction,  hemorrhage, hydrocephalus, extra-axial collection or mass lesion/mass effect. Vascular: No hyperdense vessel or unexpected calcification. Skull: Normal. Negative for fracture or focal lesion. Sinuses/Orbits: No acute finding. Other: None. CT CERVICAL SPINE FINDINGS Alignment: Mild grade 1 anterolisthesis of C4-5 is noted secondary to posterior facet joint hypertrophy. Skull base and vertebrae: No acute fracture. No primary bone lesion or focal pathologic process. Soft tissues and spinal canal: No prevertebral fluid or swelling. No visible canal hematoma. Disc levels: Moderate degenerative disc disease is noted at C5-6 and C6-7. Upper chest: Negative. Other: None. IMPRESSION: No acute intracranial abnormality seen. Moderate multilevel degenerative disc disease is noted the cervical spine. No acute abnormality is noted. Electronically Signed   By: Marijo Conception M.D.   On: 01/23/2022 14:34    PROCEDURES:  Critical Care performed: No  ..Laceration Repair  Date/Time: 01/23/2022 5:25 PM  Performed by: Delman Kitten, MD Authorized by: Delman Kitten, MD   Consent:    Consent obtained:  Verbal   Consent given by:  Patient   Risks discussed:  Infection, pain, retained foreign body, poor cosmetic result and poor wound healing Universal protocol:    Procedure explained and questions answered to patient or proxy's satisfaction: yes     Imaging studies available: yes     Patient identity confirmed:  Verbally with patient Anesthesia:    Anesthesia method:  Local infiltration   Local anesthetic:  Lidocaine 1% w/o epi and lidocaine 2% WITH epi (3 ml) Laceration details:    Location:  Scalp   Length (cm):  2.5   Depth (mm):  4 Pre-procedure details:    Preparation:  Imaging obtained to evaluate for foreign bodies and patient was prepped and draped in usual sterile fashion Exploration:    Hemostasis achieved with:  Direct pressure   Wound extent: no fascia violation noted, no foreign bodies/material noted,  no nerve damage noted, no underlying fracture noted and no vascular damage noted     Contaminated: no   Treatment:    Area cleansed with:  Saline   Amount of cleaning:  Extensive   Irrigation solution:  Sterile saline   Irrigation volume:  100 ml   Irrigation method:  Syringe   Visualized foreign bodies/material removed: no     Debridement:  None Skin repair:    Repair method:  Sutures and staples   Number of staples:  7 Approximation:    Approximation:  Close Repair type:    Repair type:  Simple Post-procedure details:    Dressing:  Sterile dressing   Procedure completion:  Tolerated well, no immediate complications    MEDICATIONS ORDERED IN ED: Medications  lidocaine-EPINEPHrine (XYLOCAINE W/EPI) 2 %-1:100000 (with pres) injection 20 mL (has no administration in time range)  ibuprofen (ADVIL) tablet  600 mg (600 mg Oral Given 01/23/22 1636)  ondansetron (ZOFRAN-ODT) disintegrating tablet 4 mg (4 mg Oral Given 01/23/22 1637)  acetaminophen (TYLENOL) tablet 650 mg (650 mg Oral Given 01/23/22 1635)     IMPRESSION / MDM / ASSESSMENT AND PLAN / ED COURSE  I reviewed the triage vital signs and the nursing notes.                              Differential diagnosis includes, but is not limited to, mechanical fall with possible concussion, intracranial hemorrhage, traumatic head injury, minor head injury, laceration, abrasion etc.  Right forearm consistent with contusion negative for fracture and she demonstrates good range of motion reports soreness is primarily in the soft tissues.  Compartments all soft nontender hand well perfused with normal neuro exam distally  Patient's presentation is most consistent with acute complicated illness / injury requiring diagnostic workup.   ----------------------------------------- 5:26 PM on 01/23/2022 ----------------------------------------- Patient fully alert well-oriented.  Reports just mild headache, no nausea.  Well oriented.   Laceration repaired with excellent effect no ongoing bleeding.  Return precautions and treatment recommendations and follow-up discussed with the patient who is agreeable with the plan.   FINAL CLINICAL IMPRESSION(S) / ED DIAGNOSES   Final diagnoses:  Concussion without loss of consciousness, initial encounter  Laceration of occipital region of scalp, initial encounter     Rx / DC Orders   ED Discharge Orders     None        Note:  This document was prepared using Dragon voice recognition software and may include unintentional dictation errors.   Delman Kitten, MD 01/23/22 [phone]

## 2022-01-23 NOTE — ED Notes (Signed)
See triage note  presents s/p fall  states she got her feet caugfht and fell  hitting her head on coffee table  no LOC

## 2022-02-28 ENCOUNTER — Encounter: Payer: Self-pay | Admitting: Podiatry

## 2022-02-28 ENCOUNTER — Ambulatory Visit (INDEPENDENT_AMBULATORY_CARE_PROVIDER_SITE_OTHER): Payer: Medicare PPO

## 2022-02-28 ENCOUNTER — Ambulatory Visit: Payer: Medicare PPO | Admitting: Podiatry

## 2022-02-28 DIAGNOSIS — M7751 Other enthesopathy of right foot: Secondary | ICD-10-CM

## 2022-02-28 DIAGNOSIS — T148XXA Other injury of unspecified body region, initial encounter: Secondary | ICD-10-CM | POA: Diagnosis not present

## 2022-02-28 DIAGNOSIS — M779 Enthesopathy, unspecified: Secondary | ICD-10-CM | POA: Diagnosis not present

## 2022-02-28 NOTE — Progress Notes (Signed)
Subjective:  Patient ID: Jo Ryan, female    DOB: January 21, 1956,  MRN: [id number]  Chief Complaint  Patient presents with   Foot Pain    "I have pain on the side of my right foot and on the top of the foot."    66 y.o. female presents with the above complaint.  Patient presents with complaint of right lateral foot pain.  Patient states that it hurts on the side on top of the foot.  Pain scale 7 out of 10 hurts with ambulation she fell about a month ago and may have sprained and twisted the foot and ankle.  She has not immobilized it.  She denies seeing anyone else prior to seeing me.  Hurts with ambulation   Review of Systems: Negative except as noted in the HPI. Denies N/V/F/Ch.  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[place])    treated with Parlodel   Prediabetes    Vitamin D deficiency     Current Outpatient Medications:    acetaminophen (TYLENOL) 325 MG tablet, Take 650 mg by mouth every 6 (six) hours as needed., Disp: , Rfl:    albuterol (PROVENTIL HFA;VENTOLIN HFA) 108 (90 BASE) MCG/ACT inhaler, Inhale 2 puffs into the lungs every 6 (six) hours as needed for wheezing., Disp: 1 Inhaler, Rfl: 2   alendronate (FOSAMAX) 70 MG tablet, Take 70 mg by mouth once a week., Disp: , Rfl:    calcium citrate-vitamin D (CITRACAL+D) 315-200 MG-UNIT tablet, Take 1 tablet by mouth 2 (two) times daily., Disp: , Rfl:    LINZESS 145 MCG CAPS capsule, Take 145 mcg by mouth daily., Disp: , Rfl:    meloxicam (MOBIC) 15 MG tablet, Take 1 tablet (15 mg total) by mouth daily., Disp: 30 tablet, Rfl: 3   pantoprazole (PROTONIX) 40 MG tablet, Take 40 mg by mouth daily., Disp: , Rfl:    propranolol (INDERAL) 40 MG tablet, Take 40 mg by mouth 2 (two) times daily., Disp: , Rfl:    SUMAtriptan (IMITREX) 50 MG tablet, Take by  mouth., Disp: , Rfl:    traZODone (DESYREL) 50 MG tablet, Take 1 tablet (50 mg total) by mouth at bedtime., Disp: 30 tablet, Rfl: 3   triamcinolone (NASACORT ALLERGY 24HR) 55 MCG/ACT AERO nasal inhaler, Place 2 sprays into the nose daily., Disp: , Rfl:   Social History   Tobacco Use  Smoking Status Never  Smokeless Tobacco Never    Allergies  Allergen Reactions   Gluten     Wheat,  potatoes, tomatoes and corn by allergy testing all cause bloating   Iodine Hives and Itching    Per pt she has hx of IV contrast allergy at [place] 10 years ago. Pt reaction was itching and hives MSY    Shellfish Allergy    Wheat Bran Swelling    Abd swelling (in large amounts)   Objective:  There were no vitals filed for this visit. There is no height or weight on file to calculate BMI. Constitutional Well developed. Well nourished.  Vascular Dorsalis pedis pulses palpable bilaterally. Posterior tibial pulses palpable bilaterally. Capillary refill normal to all digits.  No cyanosis or clubbing noted. Pedal hair growth normal.  Neurologic Normal speech. Oriented to person, place, and time. Epicritic sensation to light touch grossly present bilaterally.  Dermatologic Nails well groomed and normal in appearance. No open wounds. No skin lesions.  Orthopedic: Pain on palpation of right lateral foot.  Pain with along the course of the fifth metatarsal likely due to bone bruising.  Pain along the Achilles tendon as well as peroneal tendon as well.  Pain with resisted dorsiflexion eversion of the foot no pain with plantarflexion inversion of the foot.   Radiographs: 3 views of skeletally mature the right foot: Diffuse osteopenia noted.  No fractures noted.Pes cavus foot structure noted Assessment:   1. Bone bruise    Plan:  Patient was evaluated and treated and all questions answered.   right bone bruise versus tendinitis/stress reaction - all questions and concerns were discussed with the patient  extensive detail.  Given the amount of pain that she is having I believe she will benefit from cam boot immobilization to help with pain localized.  Once the pain has been localized we can focus on the treatment.  I discussed with patient she states understanding. -Cam boot was dispensed  No follow-ups on file.

## 2022-03-21 ENCOUNTER — Other Ambulatory Visit: Payer: Self-pay | Admitting: Podiatry

## 2022-03-28 ENCOUNTER — Ambulatory Visit: Payer: Medicare PPO | Admitting: Podiatry

## 2022-03-28 DIAGNOSIS — M79671 Pain in right foot: Secondary | ICD-10-CM

## 2022-03-28 DIAGNOSIS — T148XXA Other injury of unspecified body region, initial encounter: Secondary | ICD-10-CM

## 2022-03-28 NOTE — Progress Notes (Signed)
Subjective:  Patient ID: Jo Ryan, female    DOB: 1956/01/08,  MRN: [id number]  Chief Complaint  Patient presents with   Foot Pain    Pt stated that her foot is a lot better no pain or discomfort No new concerns     66 y.o. female presents with the above complaint.  Patient was to follow-up to right lateral bone bruise/fifth metatarsal shaft pain.  She states she is doing a lot better she does not any pain.  The boot helped considerably.  She had transition to regular shoes without any acute complaints.  Review of Systems: Negative except as noted in the HPI. Denies N/V/F/Ch.  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[place])    treated with Parlodel   Prediabetes    Vitamin D deficiency     Current Outpatient Medications:    acetaminophen (TYLENOL) 325 MG tablet, Take 650 mg by mouth every 6 (six) hours as needed., Disp: , Rfl:    albuterol (PROVENTIL HFA;VENTOLIN HFA) 108 (90 BASE) MCG/ACT inhaler, Inhale 2 puffs into the lungs every 6 (six) hours as needed for wheezing., Disp: 1 Inhaler, Rfl: 2   alendronate (FOSAMAX) 70 MG tablet, Take 70 mg by mouth once a week., Disp: , Rfl:    calcium citrate-vitamin D (CITRACAL+D) 315-200 MG-UNIT tablet, Take 1 tablet by mouth 2 (two) times daily., Disp: , Rfl:    LINZESS 145 MCG CAPS capsule, Take 145 mcg by mouth daily., Disp: , Rfl:    meloxicam (MOBIC) 15 MG tablet, Take 1 tablet (15 mg total) by mouth daily., Disp: 30 tablet, Rfl: 3   pantoprazole (PROTONIX) 40 MG tablet, Take 40 mg by mouth daily., Disp: , Rfl:    propranolol (INDERAL) 40 MG tablet, Take 40 mg by mouth 2 (two) times daily., Disp: , Rfl:    SUMAtriptan (IMITREX) 50 MG tablet, Take by mouth., Disp: , Rfl:    traZODone (DESYREL) 50 MG tablet, Take 1 tablet (50 mg total) by mouth at  bedtime., Disp: 30 tablet, Rfl: 3   triamcinolone (NASACORT ALLERGY 24HR) 55 MCG/ACT AERO nasal inhaler, Place 2 sprays into the nose daily., Disp: , Rfl:   Social History   Tobacco Use  Smoking Status Never  Smokeless Tobacco Never    Allergies  Allergen Reactions   Gluten     Wheat,  potatoes, tomatoes and corn by allergy testing all cause bloating   Iodine Hives and Itching    Per pt she has hx of IV contrast allergy at [place] 10 years ago. Pt reaction was itching and hives MSY    Shellfish Allergy    Wheat Bran Swelling    Abd swelling (in large amounts)   Objective:  There were no vitals filed for this visit. There is no height or weight on file to calculate BMI. Constitutional Well developed. Well nourished.  Vascular Dorsalis pedis pulses palpable bilaterally. Posterior tibial pulses palpable bilaterally. Capillary refill normal to all digits.  No cyanosis or clubbing noted. Pedal hair growth normal.  Neurologic Normal speech. Oriented to person, place, and time. Epicritic sensation to light touch grossly present bilaterally.  Dermatologic Nails well groomed and normal in appearance. No open wounds. No skin lesions.  Orthopedic: No further pain on palpation of right lateral foot.  No  further pain with along the course of the fifth metatarsal likely due to bone bruising.  No foot other pain along the Achilles tendon as well as peroneal tendon as well.  No further pain with resisted dorsiflexion eversion of the foot no pain with plantarflexion inversion of the foot.   Radiographs: 3 views of skeletally mature the right foot: Diffuse osteopenia noted.  No fractures noted.Pes cavus foot structure noted Assessment:   1. Bone bruise     Plan:  Patient was evaluated and treated and all questions answered.   right bone bruise versus tendinitis/stress reaction -Continue with cam boot immobilization at this point patient will transition out of the boot into regular shoes.   If it regresses she will place herself back in the boot.  I discussed shoe gear modification if any foot and ankle issues arise in future advised her to come back and see me.  She states understanding.  d  No follow-ups on file.

## 2022-07-24 ENCOUNTER — Ambulatory Visit: Admit: 2022-07-24 | Discharge status: Against Medical Advice | Payer: Medicare PPO

## 2022-07-27 ENCOUNTER — Other Ambulatory Visit: Payer: Self-pay | Admitting: Family Medicine

## 2022-07-27 DIAGNOSIS — G8929 Other chronic pain: Secondary | ICD-10-CM

## 2022-07-30 ENCOUNTER — Ambulatory Visit
Admission: RE | Admit: 2022-07-30 | Discharge: 2022-07-30 | Disposition: A | Discharge status: Home / Self Care | Payer: Medicare PPO | Source: Ambulatory Visit | Attending: Family Medicine | Admitting: Family Medicine

## 2022-07-30 DIAGNOSIS — M79671 Pain in right foot: Secondary | ICD-10-CM | POA: Diagnosis present

## 2022-07-30 DIAGNOSIS — G8929 Other chronic pain: Secondary | ICD-10-CM | POA: Diagnosis present

## 2023-02-27 IMAGING — CT CT HEAD W/O CM
4 series · 16 of 47 positions shown, 18 images · non-contrast
Comparison: None Available.

CLINICAL DATA: Head injury after fall.



[Series 2: head wo · axial · 0.41mm/px · z∈[-98,+22]mm · 7 of 32 slices shown, 9 images]
[im 4/32  brain]
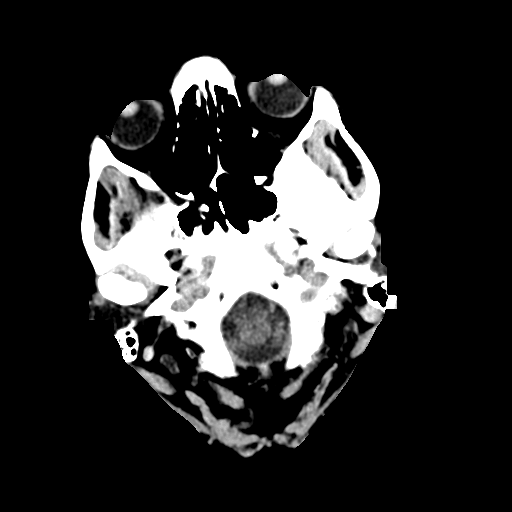
[im 4/32  bone]
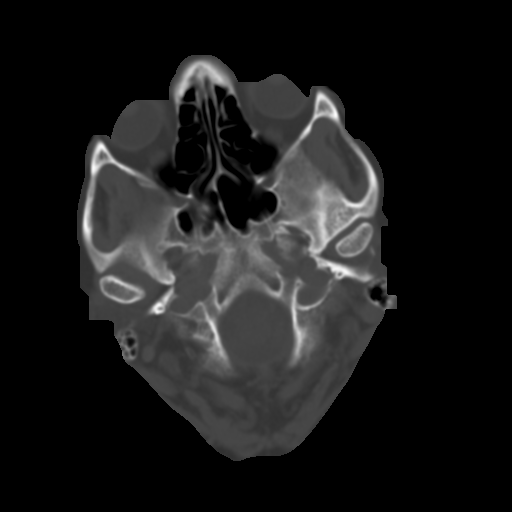
[im 8/32  brain]
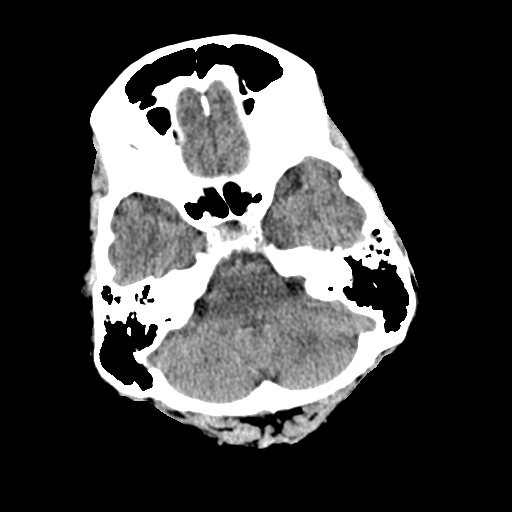
[im 12/32  brain]
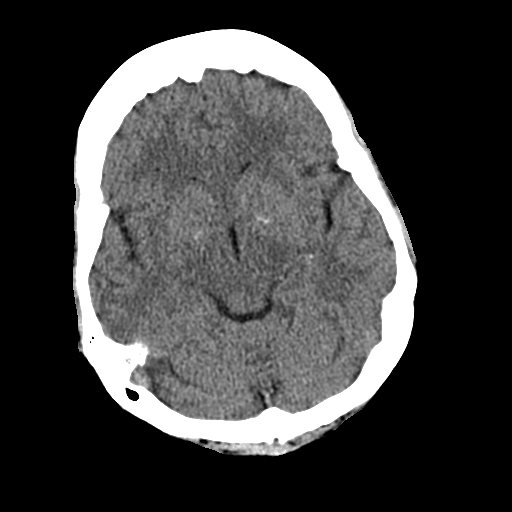
[im 16/32  brain]
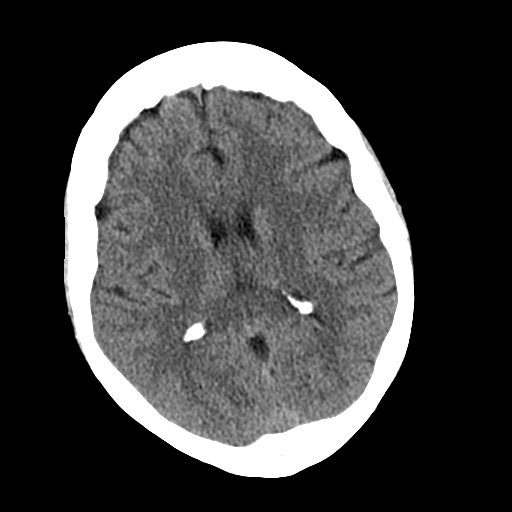
[im 20/32  brain]
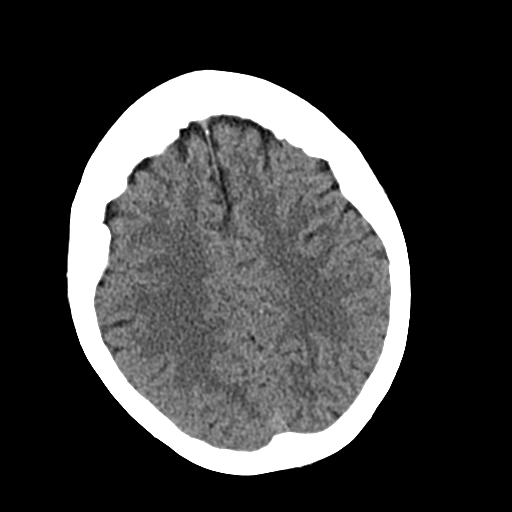
[im 20/32  bone]
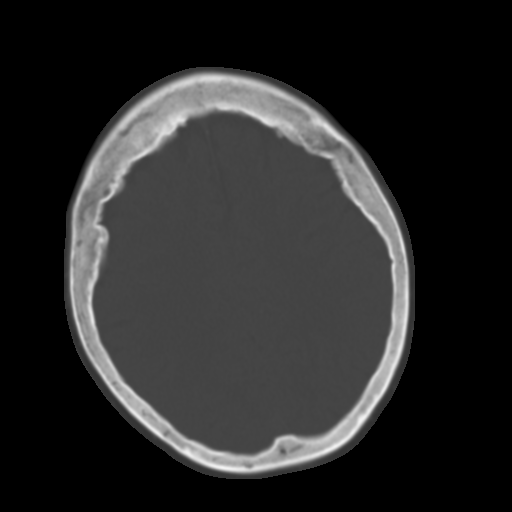
[im 24/32  brain]
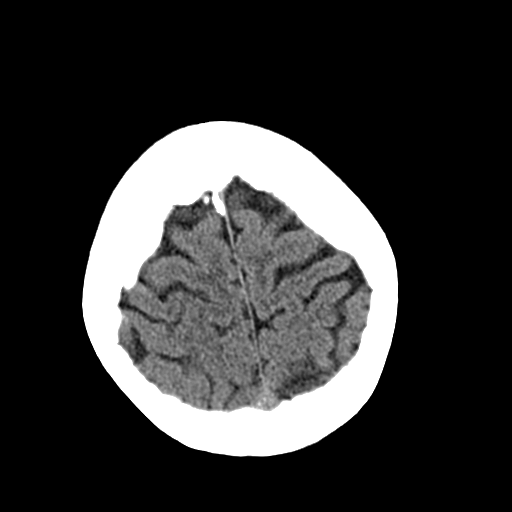
[im 28/32  brain]
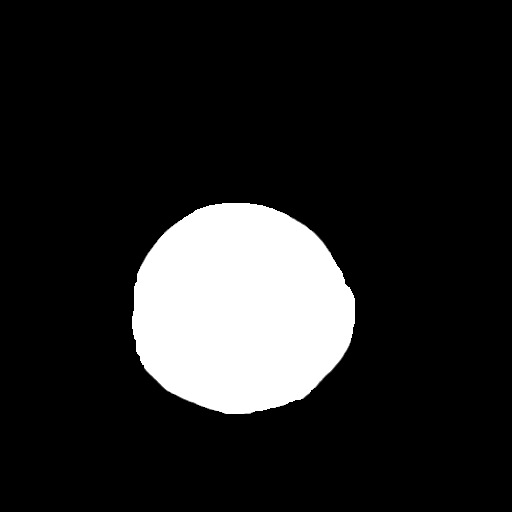

[Series 3: head bone · axial · 0.41mm/px · z∈[-99,-67]mm · 3 of 79 slices shown]
[im 8/79  bone]
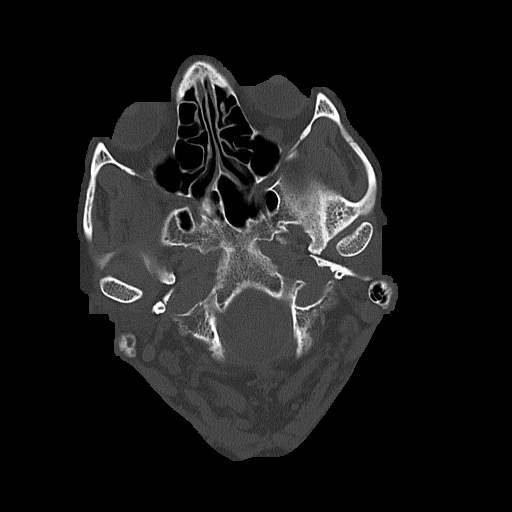
[im 16/79  bone]
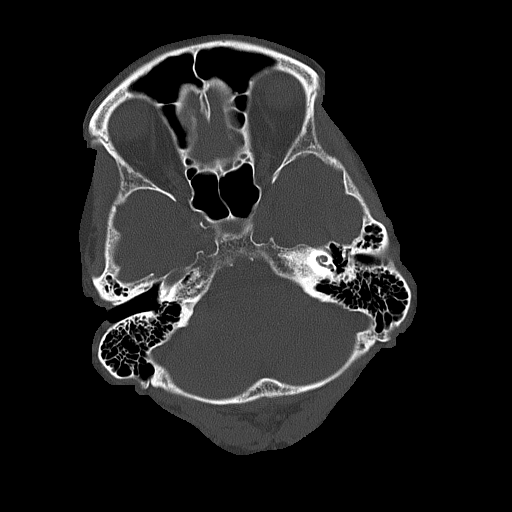
[im 24/79  bone]
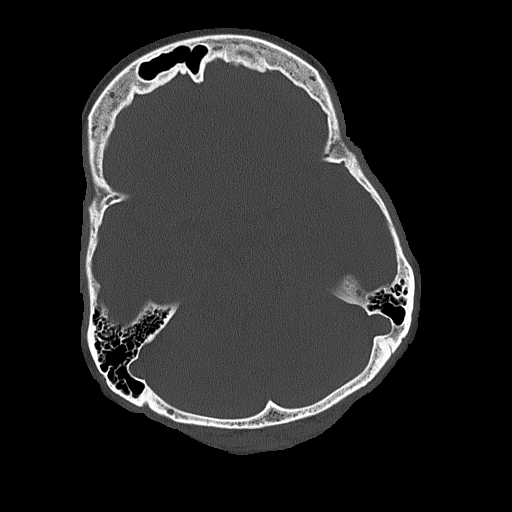

[Series 4: coronal soft tissue · coronal · 0.33mm/px · 3 of 70 slices shown]
[im 24/70  brain]
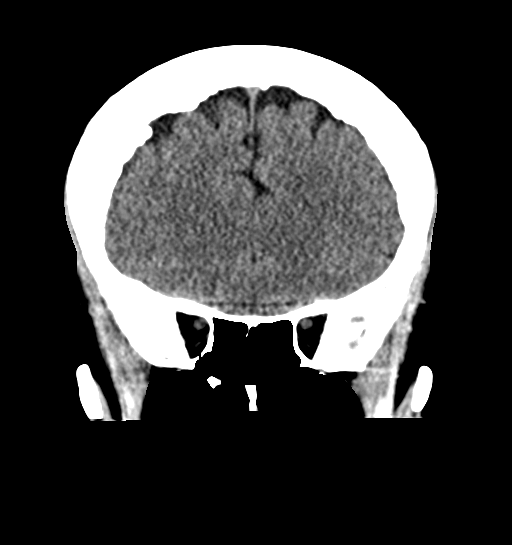
[im 31/70  brain]
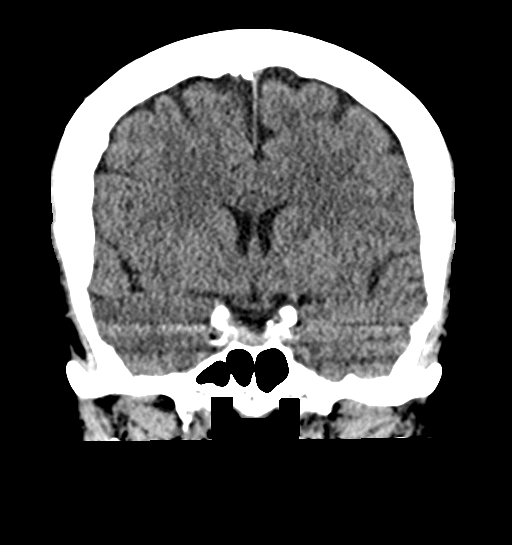
[im 39/70  brain]
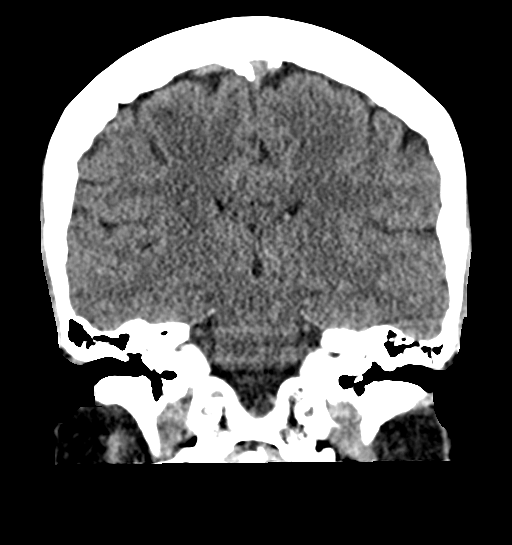

[Series 5: sagittal soft tissue · sagittal · 0.35mm/px · 3 of 55 slices shown]
[im 19/55  brain]
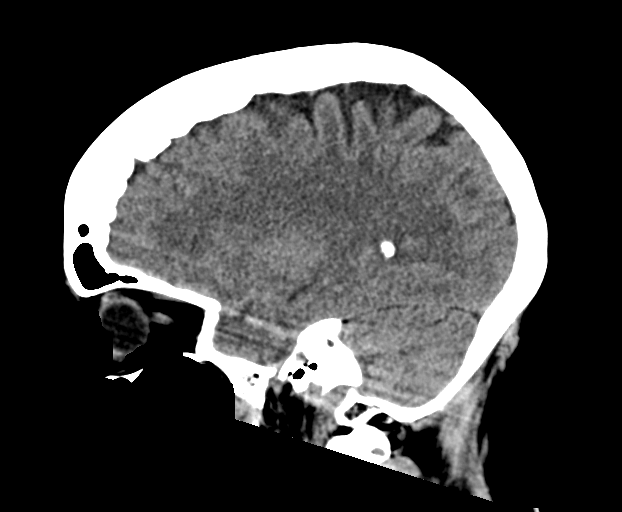
[im 28/55  brain]
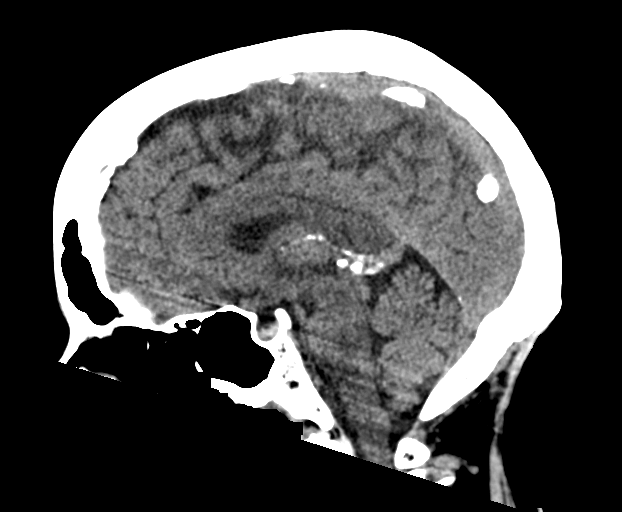
[im 37/55  brain]
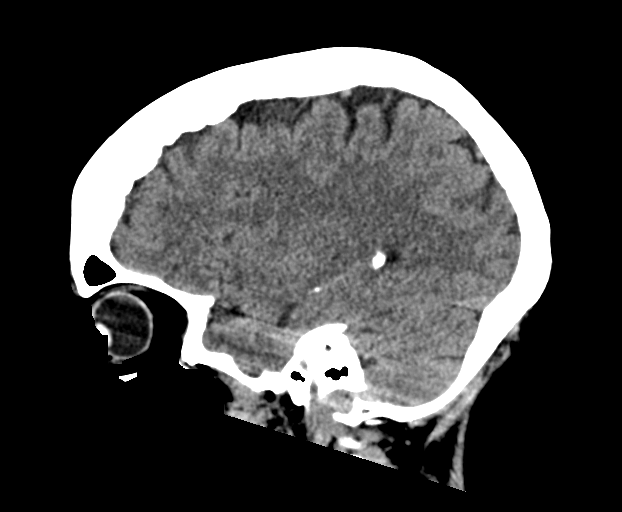

[16 of 47 positions shown; findings below may reference images not displayed]

FINDINGS: CT HEAD FINDINGS

Brain: No evidence of acute infarction, hemorrhage, hydrocephalus,
extra-axial collection or mass lesion/mass effect.

Vascular: No hyperdense vessel or unexpected calcification.

Skull: Normal. Negative for fracture or focal lesion.

Sinuses/Orbits: No acute finding.

Other: None.

CT CERVICAL SPINE FINDINGS

Alignment: Mild grade 1 anterolisthesis of C4-5 is noted secondary
to posterior facet joint hypertrophy.

Skull base and vertebrae: No acute fracture. No primary bone lesion
or focal pathologic process.

Soft tissues and spinal canal: No prevertebral fluid or swelling. No
visible canal hematoma.

Disc levels: Moderate degenerative disc disease is noted at C5-6 and
C6-7.

Upper chest: Negative.

Other: None.
IMPRESSION: No acute intracranial abnormality seen.

Moderate multilevel degenerative disc disease is noted the cervical
spine. No acute abnormality is noted.

## 2023-05-21 ENCOUNTER — Other Ambulatory Visit: Payer: Self-pay | Admitting: Family Medicine

## 2023-05-21 DIAGNOSIS — Z0000 Encounter for general adult medical examination without abnormal findings: Secondary | ICD-10-CM

## 2023-05-21 DIAGNOSIS — Z8249 Family history of ischemic heart disease and other diseases of the circulatory system: Secondary | ICD-10-CM

## 2023-05-21 DIAGNOSIS — Z9189 Other specified personal risk factors, not elsewhere classified: Secondary | ICD-10-CM

## 2023-05-24 ENCOUNTER — Ambulatory Visit
Admission: RE | Admit: 2023-05-24 | Discharge: 2023-05-24 | Disposition: A | Discharge status: Home / Self Care | Payer: Medicare PPO | Source: Ambulatory Visit | Attending: Family Medicine | Admitting: Family Medicine

## 2023-05-24 DIAGNOSIS — Z0000 Encounter for general adult medical examination without abnormal findings: Secondary | ICD-10-CM | POA: Insufficient documentation

## 2023-05-24 DIAGNOSIS — Z9189 Other specified personal risk factors, not elsewhere classified: Secondary | ICD-10-CM | POA: Insufficient documentation

## 2023-05-24 DIAGNOSIS — Z8249 Family history of ischemic heart disease and other diseases of the circulatory system: Secondary | ICD-10-CM | POA: Insufficient documentation

## 2023-07-17 ENCOUNTER — Ambulatory Visit: Payer: Medicare PPO

## 2024-02-02 ENCOUNTER — Ambulatory Visit
Admission: RE | Admit: 2024-02-02 | Discharge: 2024-02-02 | Disposition: A | Discharge status: Home / Self Care | Source: Ambulatory Visit

## 2024-02-02 VITALS — BP 125/76 | HR 65 | Temp 98.1°F | Resp 18

## 2024-02-02 DIAGNOSIS — J019 Acute sinusitis, unspecified: Secondary | ICD-10-CM | POA: Diagnosis not present

## 2024-02-02 DIAGNOSIS — H9201 Otalgia, right ear: Secondary | ICD-10-CM

## 2024-02-02 MED ORDER — AZITHROMYCIN 250 MG PO TABS: 250.0000 mg | ORAL_TABLET | Freq: Every day | ORAL | 0 refills | Status: AC

## 2024-02-02 MED ORDER — PREDNISONE 10 MG (21) PO TBPK: ORAL_TABLET | Freq: Every day | ORAL | 0 refills | Status: DC

## 2024-02-02 NOTE — Discharge Instructions (Signed)
 Your evaluated for your ear pain, on exam there are no signs of infection and symptoms are most likely related to your sinuses  While cause is most likely virus versus weather change we will empirically place you on antibiotics if symptoms do not worsen, take azithromycin as directed  Begin prednisone  every morning with food to reduce sinus pressure and help with pain, may take Tylenol  in addition to this  Would suggest she take something for congestion such as Mucinex, Sudafed or nasal spray for additional support  May follow-up with urgent care as needed

## 2024-02-02 NOTE — ED Triage Notes (Signed)
 Patient complains of pain in right hear that started yesterday. States that her ear fills clogged. Rates ear pain 7/10.

## 2024-02-02 NOTE — ED Provider Notes (Signed)
 [name]    CSN: [id number] Arrival date & time: 02/02/24  1054      History   Chief Complaint Chief Complaint  Patient presents with   Ear Fullness    Ear pain and possible sinus pain - Entered by patient   Otalgia    HPI Jo Ryan is a 68 y.o. female.   Patient presents for evaluation of sharp shooting right ear pain beginning 1 day ago.  Associated fullness but denies decreased hearing or drainage.  Endorses that the ear feels clogged.  Has been experiencing congestion and sinus pressure throughout the cheek as well as postnasal drip.  Has been experiencing headaches over the past week but unsure if related as she is currently being treated for cervical disc disease.  Denies fever.  No known sick contacts.  Has not attempted treatment.  Past Medical History:  Diagnosis Date   Arthritis    Asthma    History of migraine headaches    Migraines    Neuromuscular disorder ([facility])    cervical disk disease   Pituitary microadenoma with hyperprolactinemia ([facility])    treated with Parlodel   Prediabetes    Vitamin D  deficiency     Patient Active Problem List   Diagnosis Date Noted   Aortic atherosclerosis ([facility]) 10/13/2021   Migraine 10/11/2021   Bloating symptom 09/15/2021   Family history of polyps in the colon 09/15/2021   Right groin pain 09/15/2021   Wheat intolerance 09/15/2021   Gastroesophageal reflux disease without esophagitis 09/13/2021   Age-related osteoporosis without current pathological fracture 04/14/2020   Chronic frontal sinusitis 04/04/2019   Borderline diabetes mellitus 03/29/2017   Plantar fasciitis 01/05/2017   Vitamin D  deficiency 03/27/2016   LUQ pain 03/11/2012   Polyarthritis 11/12/2011   Sinusitis 10/09/2011   Restless legs syndrome 05/21/2011   Pituitary microadenoma with hyperprolactinemia ([facility])    History of migraine headaches    Irritable bowel syndrome with constipation    Insomnia 05/19/2011   Exercise-induced  asthma    Neuromuscular disorder ([facility])     Past Surgical History:  Procedure Laterality Date   ABDOMINAL HYSTERECTOMY     age 19   KNEE ARTHROSCOPY Left    x 3   KNEE ARTHROSCOPY Right    x 1   SEPTOPLASTY Bilateral 05/22/2019   Procedure: SEPTOPLASTY;  Surgeon: Edda Mt, MD;  Location: [facility];  Service: ENT;  Laterality: Bilateral;   TURBINATE REDUCTION Bilateral 05/22/2019   Procedure: TURBINATE REDUCTION;  Surgeon: Edda Mt, MD;  Location: [facility];  Service: ENT;  Laterality: Bilateral;    OB History   No obstetric history on file.      Home Medications    Prior to Admission medications   Medication Sig Start Date End Date Taking? Authorizing Provider  azithromycin (ZITHROMAX) 250 MG tablet Take 1 tablet (250 mg total) by mouth daily. Take first 2 tablets together, then 1 every day until finished. 02/02/24  Yes Osmani Kersten R, NP  predniSONE  (STERAPRED UNI-PAK 21 TAB) 10 MG (21) TBPK tablet Take by mouth daily. Take 6 tabs by mouth daily  for 1 days, then 5 tabs for 1 days, then 4 tabs for 1 days, then 3 tabs for 1 days, 2 tabs for 1 days, then 1 tab by mouth daily for 1 days 02/02/24  Yes Mollye Guinta R, NP  topiramate (TOPAMAX) 25 MG capsule Take 25 mg by mouth once.   Yes [provider]  acetaminophen  (TYLENOL )  325 MG tablet Take 650 mg by mouth every 6 (six) hours as needed.    [provider]  albuterol  (PROVENTIL  HFA;VENTOLIN  HFA) 108 (90 BASE) MCG/ACT inhaler Inhale 2 puffs into the lungs every 6 (six) hours as needed for wheezing. 03/11/12 05/15/19  Marylynn Verneita CROME, MD  alendronate (FOSAMAX) 70 MG tablet Take 70 mg by mouth once a week. 10/01/21   [provider]  calcium citrate-vitamin D  (CITRACAL+D) 315-200 MG-UNIT tablet Take 1 tablet by mouth 2 (two) times daily.    [provider]  LINZESS 145 MCG CAPS capsule Take 145 mcg by mouth daily. 10/19/21   [provider]  meloxicam  (MOBIC ) 15  MG tablet Take 1 tablet (15 mg total) by mouth daily. 10/24/21   Hyatt, Max T, DPM  pantoprazole (PROTONIX) 40 MG tablet Take 40 mg by mouth daily. 10/11/21   [provider]  propranolol (INDERAL) 40 MG tablet Take 40 mg by mouth 2 (two) times daily. 10/01/21   [provider]  SUMAtriptan (IMITREX) 50 MG tablet Take by mouth. 10/13/21   [provider]  traZODone  (DESYREL ) 50 MG tablet Take 1 tablet (50 mg total) by mouth at bedtime. 10/09/11   Marylynn Verneita CROME, MD  triamcinolone  (NASACORT  ALLERGY 24HR) 55 MCG/ACT AERO nasal inhaler Place 2 sprays into the nose daily.    [provider]  fluticasone  (VERAMYST) 27.5 MCG/SPRAY nasal spray Place 1 spray into the nose daily. 10/09/11 11/08/11  Marylynn Verneita CROME, MD    Family History Family History  Problem Relation Age of Onset   Diabetes Mother    Hypertension Mother    Stroke Father    Diabetes Father    Heart disease Father    Cancer Paternal Aunt        breast    Alcohol abuse Paternal Aunt     Social History Social History   Tobacco Use   Smoking status: Never   Smokeless tobacco: Never  Substance Use Topics   Alcohol use: Yes    Comment: socially   Drug use: No     Allergies   Gluten, Iodine, Shellfish allergy, and Wheat   Review of Systems Review of Systems   Physical Exam Triage Vital Signs ED Triage Vitals  Encounter Vitals Group     BP 02/02/24 1149 125/76     Girls Systolic BP Percentile --      Girls Diastolic BP Percentile --      Boys Systolic BP Percentile --      Boys Diastolic BP Percentile --      Pulse Rate 02/02/24 1149 65     Resp 02/02/24 1149 18     Temp 02/02/24 1149 98.1 F (36.7 C)     Temp Source 02/02/24 1149 Oral     SpO2 02/02/24 1149 97 %     Weight --      Height --      Head Circumference --      Peak Flow --      Pain Score 02/02/24 1154 7     Pain Loc --      Pain Education --      Exclude from Growth Chart --    No data found.  Updated  Vital Signs BP 125/76 (BP Location: Left Arm)   Pulse 65   Temp 98.1 F (36.7 C) (Oral)   Resp 18   SpO2 97%   Visual Acuity Right Eye Distance:   Left Eye Distance:  Bilateral Distance:    Right Eye Near:   Left Eye Near:    Bilateral Near:     Physical Exam Constitutional:      Appearance: Normal appearance.  HENT:     Right Ear: Tympanic membrane, ear canal and external ear normal.     Left Ear: Tympanic membrane, ear canal and external ear normal.     Nose: Congestion present.     Right Sinus: Maxillary sinus tenderness present.   Eyes:     Extraocular Movements: Extraocular movements intact.   Pulmonary:     Effort: Pulmonary effort is normal.   Musculoskeletal:     Cervical back: Normal range of motion and neck supple.   Neurological:     Mental Status: She is alert and oriented to person, place, and time.      UC Treatments / Results  Labs (all labs ordered are listed, but only abnormal results are displayed) Labs Reviewed - No data to display  EKG   Radiology No results found.  Procedures Procedures (including critical care time)  Medications Ordered in UC Medications - No data to display  Initial Impression / Assessment and Plan / UC Course  I have reviewed the triage vital signs and the nursing notes.  Pertinent labs & imaging results that were available during my care of the patient were reviewed by me and considered in my medical decision making (see chart for details).   Otalgia of the right ear, acute nonrecurrent sinusitis  Patient is in no signs of distress nor toxic appearing.  Vital signs are stable.  Low suspicion for pneumonia, pneumothorax or bronchitis and therefore will defer imaging.  Etiology most is viral versus seasonal weather change, empirically placed on antibiotics, azithromycin sent to pharmacy and prescribed prednisone  for treatment, recommended use of decongestant or mucolytic for additional support.May use  additional over-the-counter medications as needed for supportive care.  May follow-up with urgent care as needed if symptoms persist or worsen.   Final Clinical Impressions(s) / UC Diagnoses   Final diagnoses:  Otalgia of right ear  Acute non-recurrent sinusitis, unspecified location     Discharge Instructions      Your evaluated for your ear pain, on exam there are no signs of infection and symptoms are most likely related to your sinuses  While cause is most likely virus versus weather change we will empirically place you on antibiotics if symptoms do not worsen, take azithromycin as directed  Begin prednisone  every morning with food to reduce sinus pressure and help with pain, may take Tylenol  in addition to this  Would suggest she take something for congestion such as Mucinex, Sudafed or nasal spray for additional support  May follow-up with urgent care as needed   ED Prescriptions     Medication Sig Dispense Auth. Provider   predniSONE  (STERAPRED UNI-PAK 21 TAB) 10 MG (21) TBPK tablet Take by mouth daily. Take 6 tabs by mouth daily  for 1 days, then 5 tabs for 1 days, then 4 tabs for 1 days, then 3 tabs for 1 days, 2 tabs for 1 days, then 1 tab by mouth daily for 1 days 21 tablet Catlin Aycock R, NP   azithromycin (ZITHROMAX) 250 MG tablet Take 1 tablet (250 mg total) by mouth daily. Take first 2 tablets together, then 1 every day until finished. 6 tablet Latavion Halls R, NP      PDMP not reviewed this encounter.   Teresa Shelba SAUNDERS, NP 02/02/24 1220

## 2024-03-10 ENCOUNTER — Ambulatory Visit
Admission: RE | Admit: 2024-03-10 | Discharge: 2024-03-10 | Disposition: A | Discharge status: Home / Self Care | Source: Ambulatory Visit | Attending: Emergency Medicine | Admitting: Emergency Medicine

## 2024-03-10 VITALS — BP 148/78 | HR 87 | Temp 98.5°F | Resp 16

## 2024-03-10 DIAGNOSIS — M5441 Lumbago with sciatica, right side: Secondary | ICD-10-CM | POA: Diagnosis not present

## 2024-03-10 DIAGNOSIS — M5442 Lumbago with sciatica, left side: Secondary | ICD-10-CM | POA: Diagnosis not present

## 2024-03-10 MED ORDER — BACLOFEN 10 MG PO TABS: 5.0000 mg | ORAL_TABLET | Freq: Every evening | ORAL | 0 refills | Status: AC | PRN

## 2024-03-10 MED ORDER — PREDNISONE 20 MG PO TABS: 40.0000 mg | ORAL_TABLET | Freq: Every day | ORAL | 0 refills | Status: DC

## 2024-03-10 MED ORDER — METHYLPREDNISOLONE ACETATE 80 MG/ML IJ SUSP
60.0000 mg | Freq: Once | INTRAMUSCULAR | Status: AC
  Administered 2024-03-10: 60 mg via INTRAMUSCULAR

## 2024-03-10 MED ORDER — KETOROLAC TROMETHAMINE 30 MG/ML IJ SOLN
30.0000 mg | Freq: Once | INTRAMUSCULAR | Status: AC
  Administered 2024-03-10: 30 mg via INTRAMUSCULAR

## 2024-03-10 NOTE — Discharge Instructions (Addendum)
 Your pain is most likely caused by irritation to the muscles into the sciatic nerve  You have been given an injection of Toradol  and methylprednisolone  today in the clinic to help reduce inflammation and pain and ideally start to see relief within the hour  Started tomorrow take prednisone  every morning with food for 5 days to continue process above, may use Tylenol  or any topical medicines additionally  May use muscle relaxant at bedtime as needed to allow for rest  You may use heating pad in 15 minute intervals as needed for additional comfort  Begin stretching affected area daily for 10 minutes as tolerated to further loosen muscles   When lying down place pillow underneath and between knees for support  Practice good posture: head back, shoulders back, chest forward, pelvis back and weight distributed evenly on both legs  If pain persist after recommended treatment or reoccurs if may be beneficial to follow up with orthopedic specialist for evaluation, this doctor specializes in the bones and can manage your symptoms long-term with options such as but not limited to imaging, medications or physical therapy

## 2024-03-10 NOTE — ED Provider Notes (Signed)
 Jo Ryan    CSN: [id number] Arrival date & time: 03/10/24  1352      History   Chief Complaint Chief Complaint  Patient presents with   Back Pain    Pain on right side around hip area - Entered by patient    HPI Jo Ryan is a 68 y.o. female.   Patient presents for evaluation of right-sided low back pain radiating down the right lower extremity with tingling to all 5 toes present for 7 days.  Radiating 8 out of 10. exacerbated when lifting.  Can be felt with all range of motion.  Endorsing 1 day ago unable to lift the right leg as it made pain worse.  History of sciatic pain.  Has attempted use of ice, heat, Biofreeze and Voltaren gel without relief.  Past Medical History:  Diagnosis Date   Arthritis    Asthma    History of migraine headaches    Migraines    Neuromuscular disorder (HCC)    cervical disk disease   Pituitary microadenoma with hyperprolactinemia (HCC)    treated with Parlodel   Prediabetes    Vitamin D  deficiency     Patient Active Problem List   Diagnosis Date Noted   Aortic atherosclerosis (HCC) 10/13/2021   Migraine 10/11/2021   Bloating symptom 09/15/2021   Family history of polyps in the colon 09/15/2021   Right groin pain 09/15/2021   Wheat intolerance 09/15/2021   Gastroesophageal reflux disease without esophagitis 09/13/2021   Age-related osteoporosis without current pathological fracture 04/14/2020   Chronic frontal sinusitis 04/04/2019   Borderline diabetes mellitus 03/29/2017   Plantar fasciitis 01/05/2017   Vitamin D  deficiency 03/27/2016   LUQ pain 03/11/2012   Polyarthritis 11/12/2011   Sinusitis 10/09/2011   Restless legs syndrome 05/21/2011   Pituitary microadenoma with hyperprolactinemia (HCC)    History of migraine headaches    Irritable bowel syndrome with constipation    Insomnia 05/19/2011   Exercise-induced asthma    Neuromuscular disorder (HCC)     Past Surgical History:  Procedure Laterality  Date   ABDOMINAL HYSTERECTOMY     age 34   KNEE ARTHROSCOPY Left    x 3   KNEE ARTHROSCOPY Right    x 1   SEPTOPLASTY Bilateral 05/22/2019   Procedure: SEPTOPLASTY;  Surgeon: Edda Mt, MD;  Location: [facility];  Service: ENT;  Laterality: Bilateral;   TURBINATE REDUCTION Bilateral 05/22/2019   Procedure: TURBINATE REDUCTION;  Surgeon: Edda Mt, MD;  Location: [facility];  Service: ENT;  Laterality: Bilateral;    OB History   No obstetric history on file.      Home Medications    Prior to Admission medications   Medication Sig Start Date End Date Taking? Authorizing Provider  baclofen  (LIORESAL ) 10 MG tablet Take 0.5 tablets (5 mg total) by mouth at bedtime as needed for muscle spasms. 03/10/24  Yes Alica Shellhammer R, NP  predniSONE  (DELTASONE ) 20 MG tablet Take 2 tablets (40 mg total) by mouth daily. 03/10/24  Yes Damascus Feldpausch R, NP  acetaminophen  (TYLENOL ) 325 MG tablet Take 650 mg by mouth every 6 (six) hours as needed.    [provider]  albuterol  (PROVENTIL  HFA;VENTOLIN  HFA) 108 (90 BASE) MCG/ACT inhaler Inhale 2 puffs into the lungs every 6 (six) hours as needed for wheezing. 03/11/12 05/15/19  Marylynn Verneita CROME, MD  alendronate (FOSAMAX) 70 MG tablet Take 70 mg by mouth once a week. 10/01/21   [provider]  azithromycin  (ZITHROMAX ) 250 MG tablet Take 1 tablet (250 mg total) by mouth daily. Take first 2 tablets together, then 1 every day until finished. 02/02/24   Teresa Shelba SAUNDERS, NP  calcium citrate-vitamin D  (CITRACAL+D) 315-200 MG-UNIT tablet Take 1 tablet by mouth 2 (two) times daily.    [provider]  LINZESS 145 MCG CAPS capsule Take 145 mcg by mouth daily. 10/19/21   [provider]  meloxicam  (MOBIC ) 15 MG tablet Take 1 tablet (15 mg total) by mouth daily. 10/24/21   Hyatt, Max T, DPM  pantoprazole (PROTONIX) 40 MG tablet Take 40 mg by mouth daily. 10/11/21   [provider]  propranolol (INDERAL) 40  MG tablet Take 40 mg by mouth 2 (two) times daily. 10/01/21   [provider]  SUMAtriptan (IMITREX) 50 MG tablet Take by mouth. 10/13/21   [provider]  topiramate (TOPAMAX) 25 MG capsule Take 25 mg by mouth once.    [provider]  traZODone  (DESYREL ) 50 MG tablet Take 1 tablet (50 mg total) by mouth at bedtime. 10/09/11   Marylynn Verneita CROME, MD  triamcinolone  (NASACORT  ALLERGY 24HR) 55 MCG/ACT AERO nasal inhaler Place 2 sprays into the nose daily.    [provider]  fluticasone  (VERAMYST) 27.5 MCG/SPRAY nasal spray Place 1 spray into the nose daily. 10/09/11 11/08/11  Marylynn Verneita CROME, MD    Family History Family History  Problem Relation Age of Onset   Diabetes Mother    Hypertension Mother    Stroke Father    Diabetes Father    Heart disease Father    Cancer Paternal Aunt        breast    Alcohol abuse Paternal Aunt     Social History Social History   Tobacco Use   Smoking status: Never   Smokeless tobacco: Never  Substance Use Topics   Alcohol use: Yes    Comment: socially   Drug use: No     Allergies   Gluten, Iodine, Shellfish allergy, and Wheat   Review of Systems Review of Systems   Physical Exam Triage Vital Signs ED Triage Vitals  Encounter Vitals Group     BP 03/10/24 1414 (!) 148/78     Girls Systolic BP Percentile --      Girls Diastolic BP Percentile --      Boys Systolic BP Percentile --      Boys Diastolic BP Percentile --      Pulse Rate 03/10/24 1414 87     Resp 03/10/24 1414 16     Temp 03/10/24 1414 98.5 F (36.9 C)     Temp Source 03/10/24 1414 Oral     SpO2 03/10/24 1414 95 %     Weight --      Height --      Head Circumference --      Peak Flow --      Pain Score 03/10/24 1419 8     Pain Loc --      Pain Education --      Exclude from Growth Chart --    No data found.  Updated Vital Signs BP (!) 148/78 (BP Location: Left Arm)   Pulse 87   Temp 98.5 F (36.9 C) (Oral)   Resp 16   SpO2 95%    Visual Acuity Right Eye Distance:   Left Eye Distance:   Bilateral Distance:    Right Eye Near:   Left Eye Near:    Bilateral Near:  Physical Exam Constitutional:      Appearance: Normal appearance.  Eyes:     Extraocular Movements: Extraocular movements intact.  Pulmonary:     Effort: Pulmonary effort is normal.  Musculoskeletal:     Comments: Tenderness present to the right lumbar region extending into the upper buttocks, tenderness to the midline extending into the sacrum, no spinal tenderness noted, able to complete full range of motion but pain is elicited with movement, positive straight leg test to the right side  Neurological:     Mental Status: She is alert and oriented to person, place, and time.      UC Treatments / Results  Labs (all labs ordered are listed, but only abnormal results are displayed) Labs Reviewed - No data to display  EKG   Radiology No results found.  Procedures Procedures (including critical care time)  Medications Ordered in UC Medications  ketorolac  (TORADOL ) 30 MG/ML injection 30 mg (30 mg Intramuscular Given 03/10/24 1445)  methylPREDNISolone  acetate (DEPO-MEDROL ) injection 60 mg (60 mg Intramuscular Given 03/10/24 1446)    Initial Impression / Assessment and Plan / UC Course  I have reviewed the triage vital signs and the nursing notes.  Pertinent labs & imaging results that were available during my care of the patient were reviewed by me and considered in my medical decision making (see chart for details).  Acute right-sided low back pain with right-sided sciatica  No signs of saddle anesthesia, denies direct injury therefore imaging deferred, Toradol  and methylprednisolone  IM given injections given, prescribed prednisone  and baclofen  for home use recommended RICE, heat massage stretching with activity as tolerated, given written handout of back stretches, may follow-up with orthopedics if symptoms persist Final Clinical  Impressions(s) / UC Diagnoses   Final diagnoses:  Acute left-sided low back pain with left-sided sciatica     Discharge Instructions      Your pain is most likely caused by irritation to the muscles into the sciatic nerve  You have been given an injection of Toradol  and methylprednisolone  today in the clinic to help reduce inflammation and pain and ideally start to see relief within the hour  Started tomorrow take prednisone  every morning with food for 5 days to continue process above, may use Tylenol  or any topical medicines additionally  May use muscle relaxant at bedtime as needed to allow for rest  You may use heating pad in 15 minute intervals as needed for additional comfort  Begin stretching affected area daily for 10 minutes as tolerated to further loosen muscles   When lying down place pillow underneath and between knees for support  Practice good posture: head back, shoulders back, chest forward, pelvis back and weight distributed evenly on both legs  If pain persist after recommended treatment or reoccurs if may be beneficial to follow up with orthopedic specialist for evaluation, this doctor specializes in the bones and can manage your symptoms long-term with options such as but not limited to imaging, medications or physical therapy      ED Prescriptions     Medication Sig Dispense Auth. Provider   predniSONE  (DELTASONE ) 20 MG tablet Take 2 tablets (40 mg total) by mouth daily. 10 tablet Shaunette Gassner R, NP   baclofen  (LIORESAL ) 10 MG tablet Take 0.5 tablets (5 mg total) by mouth at bedtime as needed for muscle spasms. 10 each Teresa Shelba SAUNDERS, NP      PDMP not reviewed this encounter.   Teresa Shelba SAUNDERS, NP 03/10/24 1511

## 2024-03-10 NOTE — ED Triage Notes (Signed)
 Patient reports right lower pain radiating down to buttocks area. Patient has history Sciatica. Patient rates pain 8/10. Patient has tried ice, heat, biofreeze and Voltaren gel with no relief.

## 2024-09-04 ENCOUNTER — Ambulatory Visit
Admission: RE | Admit: 2024-09-04 | Discharge: 2024-09-04 | Disposition: A | Discharge status: Home / Self Care | Source: Ambulatory Visit | Attending: Emergency Medicine | Admitting: Emergency Medicine

## 2024-09-04 VITALS — BP 119/73 | HR 93 | Temp 98.3°F | Resp 17

## 2024-09-04 DIAGNOSIS — J101 Influenza due to other identified influenza virus with other respiratory manifestations: Secondary | ICD-10-CM

## 2024-09-04 LAB — POC COVID19/FLU A&B COMBO
Covid Antigen, POC: NEGATIVE
Influenza A Antigen, POC: POSITIVE — AB
Influenza B Antigen, POC: NEGATIVE

## 2024-09-04 MED ORDER — PREDNISONE 10 MG (21) PO TBPK: ORAL_TABLET | Freq: Every day | ORAL | 0 refills | Status: AC

## 2024-09-04 MED ORDER — KETOROLAC TROMETHAMINE 30 MG/ML IJ SOLN
30.0000 mg | Freq: Once | INTRAMUSCULAR | Status: AC
  Administered 2024-09-04: 30 mg via INTRAMUSCULAR

## 2024-09-04 MED ORDER — OSELTAMIVIR PHOSPHATE 75 MG PO CAPS: 75.0000 mg | ORAL_CAPSULE | Freq: Two times a day (BID) | ORAL | 0 refills | Status: AC

## 2024-09-04 MED ORDER — DEXAMETHASONE SOD PHOSPHATE PF 10 MG/ML IJ SOLN
10.0000 mg | Freq: Once | INTRAMUSCULAR | Status: AC
  Administered 2024-09-04: 10 mg via INTRAMUSCULAR

## 2024-09-04 NOTE — ED Provider Notes (Signed)
 " Jo Ryan    CSN: [id number] Arrival date & time: 09/04/24  0803      History   Chief Complaint Chief Complaint  Patient presents with   Influenza    Not surecold symptoms, headache - Entered by patient    HPI Jo Ryan is a 69 y.o. female.   Patient presents for evaluation of nasal congestion, postnasal drip, sinus pain and pressure to the right cheek affecting the teeth, sore throat, nonproductive cough and a persistent constant headache to the right side beginning 2 days ago.  Headache feels similar to prior presentation of known migraines, attempted use of Imitrex without improvement.  Has experienced wheezing beginning this morning, improved with use of inhaler, history of asthma.  Tolerable to food and liquids but has begun to experiencing altered taste.  Has additionally attempted use of Tylenol  TheraFlu and Delsym.  No known sick contact.  Denies shortness of breath, fever, abdominal symptoms, light or noise sensitivity, blurred vision.  Past Medical History:  Diagnosis Date   Arthritis    Asthma    History of migraine headaches    Migraines    Neuromuscular disorder (HCC)    cervical disk disease   Pituitary microadenoma with hyperprolactinemia (HCC)    treated with Parlodel   Prediabetes    Vitamin D  deficiency     Patient Active Problem List   Diagnosis Date Noted   Aortic atherosclerosis 10/13/2021   Migraine 10/11/2021   Bloating symptom 09/15/2021   Family history of polyps in the colon 09/15/2021   Right groin pain 09/15/2021   Wheat intolerance 09/15/2021   Gastroesophageal reflux disease without esophagitis 09/13/2021   Age-related osteoporosis without current pathological fracture 04/14/2020   Chronic frontal sinusitis 04/04/2019   Borderline diabetes mellitus 03/29/2017   Plantar fasciitis 01/05/2017   Vitamin D  deficiency 03/27/2016   LUQ pain 03/11/2012   Polyarthritis 11/12/2011   Sinusitis 10/09/2011   Restless legs  syndrome 05/21/2011   Pituitary microadenoma with hyperprolactinemia (HCC)    History of migraine headaches    Irritable bowel syndrome with constipation    Insomnia 05/19/2011   Exercise-induced asthma    Neuromuscular disorder (HCC)     Past Surgical History:  Procedure Laterality Date   ABDOMINAL HYSTERECTOMY     age 66   KNEE ARTHROSCOPY Left    x 3   KNEE ARTHROSCOPY Right    x 1   SEPTOPLASTY Bilateral 05/22/2019   Procedure: SEPTOPLASTY;  Surgeon: Edda Mt, MD;  Location: [facility];  Service: ENT;  Laterality: Bilateral;   TURBINATE REDUCTION Bilateral 05/22/2019   Procedure: TURBINATE REDUCTION;  Surgeon: Edda Mt, MD;  Location: [facility];  Service: ENT;  Laterality: Bilateral;    OB History   No obstetric history on file.      Home Medications    Prior to Admission medications  Medication Sig Start Date End Date Taking? Authorizing Provider  SUMAtriptan (IMITREX) 50 MG tablet Take by mouth. 10/13/21  Yes [provider]  acetaminophen  (TYLENOL ) 325 MG tablet Take 650 mg by mouth every 6 (six) hours as needed.    [provider]  albuterol  (PROVENTIL  HFA;VENTOLIN  HFA) 108 (90 BASE) MCG/ACT inhaler Inhale 2 puffs into the lungs every 6 (six) hours as needed for wheezing. 03/11/12 05/15/19  Marylynn Verneita CROME, MD  alendronate (FOSAMAX) 70 MG tablet Take 70 mg by mouth once a week. 10/01/21   [provider]  azithromycin  (ZITHROMAX ) 250 MG tablet Take 1  tablet (250 mg total) by mouth daily. Take first 2 tablets together, then 1 every day until finished. 02/02/24   Mihran Lebarron, Shelba SAUNDERS, NP  baclofen  (LIORESAL ) 10 MG tablet Take 0.5 tablets (5 mg total) by mouth at bedtime as needed for muscle spasms. 03/10/24   Teresa Shelba SAUNDERS, NP  calcium citrate-vitamin D  (CITRACAL+D) 315-200 MG-UNIT tablet Take 1 tablet by mouth 2 (two) times daily.    [provider]  LINZESS 145 MCG CAPS capsule Take 145 mcg by mouth daily. 10/19/21    [provider]  meloxicam  (MOBIC ) 15 MG tablet Take 1 tablet (15 mg total) by mouth daily. 10/24/21   Hyatt, Max T, DPM  pantoprazole (PROTONIX) 40 MG tablet Take 40 mg by mouth daily. 10/11/21   [provider]  predniSONE  (DELTASONE ) 20 MG tablet Take 2 tablets (40 mg total) by mouth daily. 03/10/24   Teresa Shelba SAUNDERS, NP  propranolol (INDERAL) 40 MG tablet Take 40 mg by mouth 2 (two) times daily. 10/01/21   [provider]  topiramate (TOPAMAX) 25 MG capsule Take 25 mg by mouth once.    [provider]  traZODone  (DESYREL ) 50 MG tablet Take 1 tablet (50 mg total) by mouth at bedtime. 10/09/11   Marylynn Verneita CROME, MD  triamcinolone  (NASACORT  ALLERGY 24HR) 55 MCG/ACT AERO nasal inhaler Place 2 sprays into the nose daily.    [provider]  fluticasone  (VERAMYST) 27.5 MCG/SPRAY nasal spray Place 1 spray into the nose daily. 10/09/11 11/08/11  Marylynn Verneita CROME, MD    Family History Family History  Problem Relation Age of Onset   Diabetes Mother    Hypertension Mother    Stroke Father    Diabetes Father    Heart disease Father    Cancer Paternal Aunt        breast    Alcohol abuse Paternal Aunt     Social History Social History[1]   Allergies   Gluten, Iodine, Shellfish allergy, and Wheat   Review of Systems Review of Systems  Constitutional: Negative.   HENT:  Positive for congestion, postnasal drip, sinus pressure, sinus pain and sore throat. Negative for dental problem, drooling, ear discharge, ear pain, facial swelling, hearing loss, mouth sores, nosebleeds, rhinorrhea, sneezing, tinnitus, trouble swallowing and voice change.   Respiratory:  Positive for cough and wheezing. Negative for apnea, choking, chest tightness, shortness of breath and stridor.   Neurological:  Positive for headaches. Negative for dizziness, tremors, seizures, syncope, facial asymmetry, speech difficulty, weakness, light-headedness and numbness.     Physical  Exam Triage Vital Signs ED Triage Vitals  Encounter Vitals Group     BP 09/04/24 0833 119/73     Girls Systolic BP Percentile --      Girls Diastolic BP Percentile --      Boys Systolic BP Percentile --      Boys Diastolic BP Percentile --      Pulse Rate 09/04/24 0833 93     Resp 09/04/24 0833 17     Temp 09/04/24 0833 98.3 F (36.8 C)     Temp Source 09/04/24 0833 Oral     SpO2 09/04/24 0833 92 %     Weight --      Height --      Head Circumference --      Peak Flow --      Pain Score 09/04/24 0837 7     Pain Loc --      Pain Education --  Exclude from Growth Chart --    No data found.  Updated Vital Signs BP 119/73 (BP Location: Right Arm)   Pulse 93   Temp 98.3 F (36.8 C) (Oral)   Resp 17   SpO2 92%   Visual Acuity Right Eye Distance:   Left Eye Distance:   Bilateral Distance:    Right Eye Near:   Left Eye Near:    Bilateral Near:     Physical Exam Constitutional:      Appearance: Normal appearance.  HENT:     Head: Normocephalic.     Right Ear: Tympanic membrane, ear canal and external ear normal.     Left Ear: Tympanic membrane, ear canal and external ear normal.     Nose: Congestion present.     Mouth/Throat:     Pharynx: No oropharyngeal exudate or posterior oropharyngeal erythema.  Eyes:     Extraocular Movements: Extraocular movements intact.  Cardiovascular:     Rate and Rhythm: Normal rate and regular rhythm.     Pulses: Normal pulses.     Heart sounds: Normal heart sounds.  Pulmonary:     Effort: Pulmonary effort is normal.     Breath sounds: Normal breath sounds.  Musculoskeletal:     Cervical back: Normal range of motion.  Lymphadenopathy:     Cervical: Cervical adenopathy present.  Neurological:     Mental Status: She is alert and oriented to person, place, and time. Mental status is at baseline.      UC Treatments / Results  Labs (all labs ordered are listed, but only abnormal results are displayed) Labs Reviewed  POC  COVID19/FLU A&B COMBO - Abnormal; Notable for the following components:      Result Value   Influenza A Antigen, POC Positive (*)    All other components within normal limits    EKG   Radiology No results found.  Procedures Procedures (including critical care time)  Medications Ordered in UC Medications - No data to display  Initial Impression / Assessment and Plan / UC Course  I have reviewed the triage vital signs and the nursing notes.  Pertinent labs & imaging results that were available during my care of the patient were reviewed by me and considered in my medical decision making (see chart for details).  Influenza A  Patient is in no signs of distress nor toxic appearing.  Vital signs are stable.  Low suspicion for pneumonia, pneumothorax or bronchitis and therefore will defer imaging.  COVID test negative, discussed findings with patient, discussed quarantining if with fever, Toradol  and Decadron  IM given for management of wheezing and migraine, prescribed prednisone  and Tamiflu  for home use, discussed administration.May use additional over-the-counter medications as needed for supportive care.  May follow-up with urgent care as needed if symptoms persist or worsen.   Final Clinical Impressions(s) / UC Diagnoses   Final diagnoses:  None   Discharge Instructions   None    ED Prescriptions   None    PDMP not reviewed this encounter.     [1]  Social History Tobacco Use   Smoking status: Never   Smokeless tobacco: Never  Substance Use Topics   Alcohol use: Yes    Comment: socially   Drug use: No     Teresa Shelba SAUNDERS, NP 09/04/24 [phone]  "

## 2024-09-04 NOTE — ED Triage Notes (Signed)
 Pt present with c/o headaches, productive cough, nasal congestion x 2 days, denies fever. Pt states she has taken Imitrex and has not had relief. Last does at 0300.

## 2024-09-04 NOTE — Discharge Instructions (Addendum)
 Influenza A is a virus and should steadily improve in time it can take up to 7 to 10 days before you truly start to see a turnaround however things will get better  Begin Tamiflu  every morning and every evening for 5 days to reduce the amount of virus in the body which helps to minimize symptoms  You have been given an injection of steroids and Toradol  to help reduce inflammation throughout the body and ideally will help calm sinus pressure, wheezing and your headache  Start tomorrow take prednisone  every morning with food as directed to continue the process above   Will need to quarantine until without fever for 24 hours.,  If no fever may continue activity wearing mask for 5 days from the start of your symptoms    You can take Tylenol  as needed for fever reduction and pain relief.   For cough: honey 1/2 to 1 teaspoon (you can dilute the honey in water or another fluid).  You can also use guaifenesin and dextromethorphan for cough. You can use a humidifier for chest congestion and cough.  If you don't have a humidifier, you can sit in the bathroom with the hot shower running.      For sore throat: try warm salt water gargles, cepacol lozenges, throat spray, warm tea or water with lemon/honey, popsicles or ice, or OTC cold relief medicine for throat discomfort.   For congestion: take a daily anti-histamine like Zyrtec, Claritin, and a oral decongestant, such as pseudoephedrine.  You can also use Flonase  1-2 sprays in each nostril daily.   It is important to stay hydrated: drink plenty of fluids (water, gatorade/powerade/pedialyte, juices, or teas) to keep your throat moisturized and help further relieve irritation/discomfort.
